# Patient Record
Sex: Female | Born: 1960 | Race: Black or African American | Hispanic: No | Marital: Single | Smoking: Never smoker
Health system: Southern US, Community
[De-identification: ages and names within clinical notes are randomized; demographics above are authoritative.]

## PROBLEM LIST (undated history)

## (undated) DIAGNOSIS — D219 Benign neoplasm of connective and other soft tissue, unspecified: Secondary | ICD-10-CM

## (undated) DIAGNOSIS — R7303 Prediabetes: Secondary | ICD-10-CM

## (undated) DIAGNOSIS — E785 Hyperlipidemia, unspecified: Secondary | ICD-10-CM

## (undated) DIAGNOSIS — D689 Coagulation defect, unspecified: Secondary | ICD-10-CM

## (undated) DIAGNOSIS — N939 Abnormal uterine and vaginal bleeding, unspecified: Secondary | ICD-10-CM

## (undated) DIAGNOSIS — I10 Essential (primary) hypertension: Secondary | ICD-10-CM

## (undated) DIAGNOSIS — Z9289 Personal history of other medical treatment: Secondary | ICD-10-CM

## (undated) DIAGNOSIS — A64 Unspecified sexually transmitted disease: Secondary | ICD-10-CM

## (undated) DIAGNOSIS — H269 Unspecified cataract: Secondary | ICD-10-CM

## (undated) DIAGNOSIS — T7840XA Allergy, unspecified, initial encounter: Secondary | ICD-10-CM

## (undated) HISTORY — DX: Allergy, unspecified, initial encounter: T78.40XA

## (undated) HISTORY — DX: Prediabetes: R73.03

## (undated) HISTORY — DX: Abnormal uterine and vaginal bleeding, unspecified: N93.9

## (undated) HISTORY — DX: Unspecified sexually transmitted disease: A64

## (undated) HISTORY — DX: Hyperlipidemia, unspecified: E78.5

## (undated) HISTORY — DX: Coagulation defect, unspecified: D68.9

## (undated) HISTORY — PX: PERIPHERAL VASCULAR THROMBECTOMY: CATH118306

## (undated) HISTORY — DX: Unspecified cataract: H26.9

## (undated) HISTORY — DX: Personal history of other medical treatment: Z92.89

## (undated) HISTORY — DX: Benign neoplasm of connective and other soft tissue, unspecified: D21.9

---

## 1998-04-11 ENCOUNTER — Emergency Department (HOSPITAL_COMMUNITY): Admission: EM | Admit: 1998-04-11 | Discharge: 1998-04-11 | Payer: Self-pay | Admitting: Emergency Medicine

## 1998-09-11 ENCOUNTER — Encounter: Payer: Self-pay | Admitting: Emergency Medicine

## 1998-09-11 ENCOUNTER — Emergency Department (HOSPITAL_COMMUNITY): Admission: EM | Admit: 1998-09-11 | Discharge: 1998-09-11 | Payer: Self-pay | Admitting: Emergency Medicine

## 1999-06-25 ENCOUNTER — Emergency Department (HOSPITAL_COMMUNITY): Admission: EM | Admit: 1999-06-25 | Discharge: 1999-06-25 | Payer: Self-pay | Admitting: Emergency Medicine

## 2000-03-26 ENCOUNTER — Emergency Department (HOSPITAL_COMMUNITY): Admission: EM | Admit: 2000-03-26 | Discharge: 2000-03-26 | Payer: Self-pay | Admitting: Emergency Medicine

## 2000-12-29 ENCOUNTER — Emergency Department (HOSPITAL_COMMUNITY): Admission: EM | Admit: 2000-12-29 | Discharge: 2000-12-29 | Payer: Self-pay | Admitting: Emergency Medicine

## 2002-03-24 ENCOUNTER — Emergency Department (HOSPITAL_COMMUNITY): Admission: EM | Admit: 2002-03-24 | Discharge: 2002-03-24 | Payer: Self-pay

## 2007-04-05 ENCOUNTER — Emergency Department (HOSPITAL_COMMUNITY): Admission: EM | Admit: 2007-04-05 | Discharge: 2007-04-05 | Payer: Self-pay | Admitting: Emergency Medicine

## 2008-03-17 ENCOUNTER — Emergency Department (HOSPITAL_COMMUNITY): Admission: EM | Admit: 2008-03-17 | Discharge: 2008-03-17 | Payer: Self-pay | Admitting: Emergency Medicine

## 2008-05-05 ENCOUNTER — Emergency Department (HOSPITAL_COMMUNITY): Admission: EM | Admit: 2008-05-05 | Discharge: 2008-05-05 | Payer: Self-pay | Admitting: Emergency Medicine

## 2008-06-05 ENCOUNTER — Emergency Department (HOSPITAL_COMMUNITY): Admission: EM | Admit: 2008-06-05 | Discharge: 2008-06-05 | Payer: Self-pay | Admitting: Emergency Medicine

## 2009-01-24 ENCOUNTER — Ambulatory Visit (HOSPITAL_COMMUNITY): Admission: RE | Admit: 2009-01-24 | Discharge: 2009-01-24 | Payer: Self-pay | Admitting: Obstetrics

## 2009-01-27 ENCOUNTER — Emergency Department (HOSPITAL_COMMUNITY): Admission: EM | Admit: 2009-01-27 | Discharge: 2009-01-27 | Payer: Self-pay | Admitting: Emergency Medicine

## 2010-09-16 HISTORY — PX: IVC FILTER INSERTION: CATH118245

## 2010-09-16 HISTORY — PX: BACK SURGERY: SHX140

## 2010-09-16 HISTORY — PX: ABDOMINAL ADHESION SURGERY: SHX90

## 2010-10-08 ENCOUNTER — Inpatient Hospital Stay (HOSPITAL_COMMUNITY)
Admission: EM | Admit: 2010-10-08 | Discharge: 2010-10-15 | Payer: Self-pay | Source: Home / Self Care | Attending: Internal Medicine | Admitting: Internal Medicine

## 2010-10-08 NOTE — H&P (Addendum)
Diana Mack, Diana Mack              ACCOUNT NO.:  [id number]  MEDICAL RECORD NO.:  [id number]          PATIENT TYPE:  EMS  LOCATION:  ED                           FACILITY:  [facility]  PHYSICIAN:  Talmage Nap, MD  DATE OF BIRTH:  Jan 31, 1961  DATE OF ADMISSION:  10/08/2010 DATE OF DISCHARGE:                             HISTORY & PHYSICAL   PRIMARY CARE PHYSICIAN:  Unassigned.  OB-GYN:  Kathreen Cosier, M.D.  CHIEF COMPLAINT:  Swelling of left leg of about a week's duration.  HISTORY OF PRESENT ILLNESS:  The patient is a 50 year old, obese, African-American female with no known medical history only recently on treatment for UTI, presenting to the emergency room with swelling of the left leg that was said to have been getting progressively worse.  The patient claimed that a week prior to presenting to the emergency room she noticed slight swelling in the left calf, and this was said to be getting progressively worse with difficulty in ambulation.  She had about one to two episodes of fall prior to presenting to the emergency room.  She denied any history of sharp chest pain.  She denied any history of shortness of breath.  She denied any history of recent travel.  She also denied any history of oral contraceptive pills.  She also she also denied any systemic symptoms, i.e. no fever, no chills, no rigor, and no trauma.  Swelling was said to be getting progressively worse.  Hence, the patient presented to the emergency room to be evaluated.  PAST MEDICAL HISTORY:  Uterine fibroid and most recently on treatment for urinary tract infection.  PAST SURGICAL HISTORY:  None.  PREADMISSION MEDICATIONS: 1. Hydrocodone/acetaminophen 5/500 on a p.r.n. basis. 2. Cephalexin 500 mg p.o. q.6. 3. Promethazine 25 mg p.o. p.r.n. 4. Ibuprofen 800 mg p.o. q.h.s.  ALLERGIES:  No known allergies.  SOCIAL HISTORY:  Negative for tobacco use.  Occasionally drinks alcohol.  FAMILY HISTORY:   Negative for any blood clot or thrombophilia.  REVIEW OF SYSTEMS:  The patient denies any history of headaches.  No blurry vision.  No nausea or vomiting.  No fever.  No chills.  No rigor. No chest pain or shortness of breath.  No cough.  No abdominal discomfort.  No diarrhea or hematochezia.  No dysuria or hematuria.  She has swelling of the left calf.  No associated shortness of breath and no associated fever, chills or rigor, and she also denied any history of known psychiatric disorder.  PHYSICAL EXAMINATION:  Very pleasant, obese lady not in any distress, well-hydrated. VITAL SIGNS: Blood pressure is blood pressure is 115/103, pulse is 93, respiratory 20, temperature is 99.3.  HEENT: Mild pallor, but pupils are reactive to light and extraocular muscles are intact. NECK:  No jugular venous distention.  No carotid bruit.  No lymphadenopathy. CHEST:  Clear to auscultation. HEART:  Sounds are 1 and 2. ABDOMEN:  Obese, nontender.  Liver and spleen not palpable.  Bowel sounds are positive. EXTREMITIES:  Tenderness in the left calf, and left calf bigger than the right calf.  [name]' sign not elicited. NEUROPSYCHIATRIC EVALUATION: Unremarkable. MUSCULOSKELETAL SYSTEM:  Unremarkable. SKIN:  Normal turgor.  LABORATORY DATA:  Initial urinalysis unremarkable. Chemistry showed a sodium of 141, potassium of 4.2, chloride of 105, BUN is  15, creatinine is 1.9, glucose is 106.  Hematological indices showed WBC of 12.0, hemoglobin 11.5, hematocrit of 30.8, MCV of 86.9, platelet count of 512, neutrophil of 80% and absolute neutrophil count of 9.6.  Coagulation profile showed a PTT 34, PT 14.8, INR 1.14.  Duplex ultrasound was positive for extensive DVT and partial thrombosis of the posterior tibial vein becoming fairly thrombosed in the proximal popliteal to profunda femoral and mid common femoral veins.  There is partial thrombosis in the mid to proximal common femoral vein and there is  also thrombosis of the superficial system of the proximal greater saphenous and the saphenofemoral junction.  There is for propagation of DVT noted.  IMPRESSION: 1. Deep venous thrombosis, left leg. 2. Obesity. 3. Incompletely treated urinary tract infection. 4. Hypertension (newly diagnosed). 5. Anemia.  PLAN:  To admit the patient to general medical floor.  The patient will be anticoagulated with Lovenox 4 mg per PEG subcutaneous q.12 hourly, and she will also have Coumadin 10 mg p.o. stat and dosing to be done by pharmacy.  Pain control will be done with Dilaudid 1 mg IV q.4 p.r.n. She will be on Protonix 40 mg p.o. daily for GI prophylaxis, Avelox 400 mg p.o. daily for the incompletely treated UTI, and Lopressor 50 mg p.o. b.i.d. for the newly diagnosed hypertension.  Further labs to be ordered on this patient will include: 1. Serum protein C and S levels. 2. Serum antithrombin III level. 3. Factor V Leiden mutation. 4. CBCD,CMP and magnesium will be repeated in a.m. 5. PT and INR will be repeated in a.m.  The patient will be followed and evaluated on a day-to-day basis.    Talmage Nap, MD    CN/MEDQ  D:  10/08/2010  T:  10/08/2010  Job:  [id number]  Electronically Signed by Talmage Nap  on 10/08/2010 07:38:19 PM

## 2010-10-09 LAB — URINALYSIS, ROUTINE W REFLEX MICROSCOPIC
Bilirubin Urine: NEGATIVE
Hgb urine dipstick: NEGATIVE
Ketones, ur: NEGATIVE mg/dL
Nitrite: NEGATIVE
Protein, ur: NEGATIVE mg/dL
Specific Gravity, Urine: 1.007 (ref 1.005–1.030)
Urine Glucose, Fasting: NEGATIVE mg/dL
Urobilinogen, UA: 0.2 mg/dL (ref 0.0–1.0)
pH: 5.5 (ref 5.0–8.0)

## 2010-10-09 LAB — POCT I-STAT, CHEM 8
BUN: 16 mg/dL (ref 6–23)
Calcium, Ion: 1.2 mmol/L (ref 1.12–1.32)
Chloride: 105 mEq/L (ref 96–112)
Creatinine, Ser: 1.9 mg/dL — ABNORMAL HIGH (ref 0.4–1.2)
Glucose, Bld: 106 mg/dL — ABNORMAL HIGH (ref 70–99)
HCT: 36 % (ref 36.0–46.0)
Hemoglobin: 12.2 g/dL (ref 12.0–15.0)
Potassium: 4.2 mEq/L (ref 3.5–5.1)
Sodium: 141 mEq/L (ref 135–145)
TCO2: 29 mmol/L (ref 0–100)

## 2010-10-09 LAB — CBC
MCH: 29.6 pg (ref 26.0–34.0)
MCHC: 34 g/dL (ref 30.0–36.0)
MCV: 86.9 fL (ref 78.0–100.0)
Platelets: 512 10*3/uL — ABNORMAL HIGH (ref 150–400)
RBC: 3.89 MIL/uL (ref 3.87–5.11)
RDW: 13.1 % (ref 11.5–15.5)

## 2010-10-09 LAB — DIFFERENTIAL
Basophils Relative: 0 % (ref 0–1)
Eosinophils Absolute: 0.2 10*3/uL (ref 0.0–0.7)
Eosinophils Relative: 1 % (ref 0–5)
Lymphs Abs: 1.6 10*3/uL (ref 0.7–4.0)
Monocytes Relative: 6 % (ref 3–12)
Neutrophils Relative %: 80 % — ABNORMAL HIGH (ref 43–77)

## 2010-10-09 LAB — PROTEIN, TOTAL: Total Protein: 8.4 g/dL — ABNORMAL HIGH (ref 6.0–8.3)

## 2010-10-09 LAB — PROTIME-INR: Prothrombin Time: 14.8 seconds (ref 11.6–15.2)

## 2010-10-10 LAB — SODIUM, URINE, RANDOM: Sodium, Ur: 43 mEq/L

## 2010-10-10 LAB — CBC
HCT: 32.3 % — ABNORMAL LOW (ref 36.0–46.0)
HCT: 29.9 % — ABNORMAL LOW (ref 36.0–46.0)
Hemoglobin: 10.9 g/dL — ABNORMAL LOW (ref 12.0–15.0)
Hemoglobin: 9.9 g/dL — ABNORMAL LOW (ref 12.0–15.0)
MCH: 29.3 pg (ref 26.0–34.0)
MCHC: 33.7 g/dL (ref 30.0–36.0)
MCV: 86.8 fL (ref 78.0–100.0)
RBC: 3.42 MIL/uL — ABNORMAL LOW (ref 3.87–5.11)
RDW: 13.2 % (ref 11.5–15.5)
RDW: 13.3 % (ref 11.5–15.5)
WBC: 10.6 10*3/uL — ABNORMAL HIGH (ref 4.0–10.5)

## 2010-10-10 LAB — COMPREHENSIVE METABOLIC PANEL
ALT: 16 U/L (ref 0–35)
Alkaline Phosphatase: 66 U/L (ref 39–117)
BUN: 15 mg/dL (ref 6–23)
CO2: 28 mEq/L (ref 19–32)
Calcium: 9.1 mg/dL (ref 8.4–10.5)
Chloride: 104 mEq/L (ref 96–112)
Creatinine, Ser: 1.53 mg/dL — ABNORMAL HIGH (ref 0.4–1.2)
GFR calc Af Amer: 44 mL/min — ABNORMAL LOW (ref >60)
GFR calc non Af Amer: 36 mL/min — ABNORMAL LOW (ref >60)
Glucose, Bld: 103 mg/dL — ABNORMAL HIGH (ref 70–99)
Glucose, Bld: 101 mg/dL — ABNORMAL HIGH (ref 70–99)
Potassium: 4.4 mEq/L (ref 3.5–5.1)
Sodium: 139 mEq/L (ref 135–145)
Total Bilirubin: 0.5 mg/dL (ref 0.3–1.2)
Total Protein: 8.2 g/dL (ref 6.0–8.3)
Total Protein: 7.7 g/dL (ref 6.0–8.3)

## 2010-10-10 LAB — PROTIME-INR
INR: 1.22 (ref 0.00–1.49)
INR: 1.46 (ref 0.00–1.49)
Prothrombin Time: 17.9 seconds — ABNORMAL HIGH (ref 11.6–15.2)

## 2010-10-10 LAB — DIFFERENTIAL
Basophils Absolute: 0 10*3/uL (ref 0.0–0.1)
Basophils Absolute: 0 10*3/uL (ref 0.0–0.1)
Eosinophils Relative: 2 % (ref 0–5)
Lymphocytes Relative: 15 % (ref 12–46)
Lymphocytes Relative: 17 % (ref 12–46)
Lymphs Abs: 1.5 10*3/uL (ref 0.7–4.0)
Monocytes Absolute: 0.8 10*3/uL (ref 0.1–1.0)
Monocytes Relative: 7 % (ref 3–12)
Neutro Abs: 8 10*3/uL — ABNORMAL HIGH (ref 1.7–7.7)
Neutro Abs: 7.8 10*3/uL — ABNORMAL HIGH (ref 1.7–7.7)

## 2010-10-10 LAB — MAGNESIUM
Magnesium: 2 mg/dL (ref 1.5–2.5)
Magnesium: 2 mg/dL (ref 1.5–2.5)

## 2010-10-10 LAB — URINE CULTURE
Colony Count: NO GROWTH
Culture  Setup Time: 201201240143

## 2010-10-11 LAB — HOMOCYSTEINE: Homocysteine: 11.9 umol/L (ref 4.0–15.4)

## 2010-10-11 LAB — CBC
HCT: 31.2 % — ABNORMAL LOW (ref 36.0–46.0)
Hemoglobin: 10.2 g/dL — ABNORMAL LOW (ref 12.0–15.0)
RBC: 3.57 MIL/uL — ABNORMAL LOW (ref 3.87–5.11)
RDW: 13.1 % (ref 11.5–15.5)
WBC: 10.8 10*3/uL — ABNORMAL HIGH (ref 4.0–10.5)

## 2010-10-11 LAB — CARDIOLIPIN ANTIBODIES, IGG, IGM, IGA
Anticardiolipin IgA: 4 APL U/mL — ABNORMAL LOW (ref <22)
Anticardiolipin IgG: 4 GPL U/mL — ABNORMAL LOW (ref <23)

## 2010-10-11 LAB — LUPUS ANTICOAGULANT PANEL
PTT Lupus Anticoagulant: 64.1 secs — ABNORMAL HIGH (ref 30.0–45.6)
PTTLA Confirmation: 14.6 secs — ABNORMAL HIGH (ref <8.0)

## 2010-10-11 LAB — BASIC METABOLIC PANEL
Chloride: 104 mEq/L (ref 96–112)
Creatinine, Ser: 1.56 mg/dL — ABNORMAL HIGH (ref 0.4–1.2)
GFR calc Af Amer: 43 mL/min — ABNORMAL LOW (ref >60)

## 2010-10-11 LAB — PROTIME-INR: INR: 1.37 (ref 0.00–1.49)

## 2010-10-11 LAB — MAGNESIUM: Magnesium: 2 mg/dL (ref 1.5–2.5)

## 2010-10-12 LAB — BASIC METABOLIC PANEL
CO2: 28 mEq/L (ref 19–32)
Calcium: 9 mg/dL (ref 8.4–10.5)
Creatinine, Ser: 1.26 mg/dL — ABNORMAL HIGH (ref 0.4–1.2)
Glucose, Bld: 100 mg/dL — ABNORMAL HIGH (ref 70–99)
Sodium: 141 mEq/L (ref 135–145)

## 2010-10-12 LAB — CBC
HCT: 31 % — ABNORMAL LOW (ref 36.0–46.0)
Hemoglobin: 10.2 g/dL — ABNORMAL LOW (ref 12.0–15.0)
MCH: 28.6 pg (ref 26.0–34.0)
MCHC: 32.9 g/dL (ref 30.0–36.0)

## 2010-10-12 LAB — DIFFERENTIAL
Lymphocytes Relative: 21 % (ref 12–46)
Lymphs Abs: 1.7 10*3/uL (ref 0.7–4.0)
Monocytes Absolute: 0.5 10*3/uL (ref 0.1–1.0)
Monocytes Relative: 6 % (ref 3–12)
Neutro Abs: 5.3 10*3/uL (ref 1.7–7.7)

## 2010-10-12 LAB — URINALYSIS, ROUTINE W REFLEX MICROSCOPIC
Nitrite: NEGATIVE
Protein, ur: NEGATIVE mg/dL
Urine Glucose, Fasting: NEGATIVE mg/dL

## 2010-10-12 LAB — PROTIME-INR: Prothrombin Time: 21.3 seconds — ABNORMAL HIGH (ref 11.6–15.2)

## 2010-10-12 LAB — URINE MICROSCOPIC-ADD ON

## 2010-10-13 LAB — BASIC METABOLIC PANEL
CO2: 27 mEq/L (ref 19–32)
Calcium: 9.2 mg/dL (ref 8.4–10.5)
Chloride: 102 mEq/L (ref 96–112)
Creatinine, Ser: 1.13 mg/dL (ref 0.4–1.2)
Glucose, Bld: 104 mg/dL — ABNORMAL HIGH (ref 70–99)

## 2010-10-13 LAB — CBC
MCH: 28.5 pg (ref 26.0–34.0)
MCHC: 32.9 g/dL (ref 30.0–36.0)
MCV: 86.7 fL (ref 78.0–100.0)
Platelets: 476 10*3/uL — ABNORMAL HIGH (ref 150–400)
RDW: 12.9 % (ref 11.5–15.5)
WBC: 9.2 10*3/uL (ref 4.0–10.5)

## 2010-10-13 LAB — DIFFERENTIAL
Eosinophils Absolute: 0.4 10*3/uL (ref 0.0–0.7)
Eosinophils Relative: 4 % (ref 0–5)
Lymphs Abs: 1.8 10*3/uL (ref 0.7–4.0)
Monocytes Absolute: 0.6 10*3/uL (ref 0.1–1.0)
Monocytes Relative: 6 % (ref 3–12)

## 2010-10-14 LAB — BASIC METABOLIC PANEL
BUN: 12 mg/dL (ref 6–23)
Creatinine, Ser: 1.14 mg/dL (ref 0.4–1.2)
GFR calc non Af Amer: 51 mL/min — ABNORMAL LOW (ref >60)

## 2010-10-14 LAB — PROTIME-INR
INR: 2.17 — ABNORMAL HIGH (ref 0.00–1.49)
Prothrombin Time: 24.3 seconds — ABNORMAL HIGH (ref 11.6–15.2)

## 2010-10-14 LAB — URINE CULTURE

## 2010-10-15 LAB — PROTIME-INR
INR: 2.09 — ABNORMAL HIGH (ref 0.00–1.49)
Prothrombin Time: 23.6 seconds — ABNORMAL HIGH (ref 11.6–15.2)

## 2010-10-15 LAB — BASIC METABOLIC PANEL
Chloride: 102 mEq/L (ref 96–112)
Creatinine, Ser: 1.09 mg/dL (ref 0.4–1.2)
GFR calc Af Amer: >60 mL/min (ref >60)
GFR calc non Af Amer: 54 mL/min — ABNORMAL LOW (ref >60)
Potassium: 3.9 mEq/L (ref 3.5–5.1)

## 2010-10-15 LAB — PHOSPHORUS: Phosphorus: 4.6 mg/dL (ref 2.3–4.6)

## 2010-10-15 LAB — MAGNESIUM: Magnesium: 2.1 mg/dL (ref 1.5–2.5)

## 2010-10-15 NOTE — Discharge Summary (Signed)
Diana Mack, Diana Mack              ACCOUNT NO.:  [id number]  MEDICAL RECORD NO.:  [id number]          PATIENT TYPE:  INP  LOCATION:  1603                         FACILITY:  [facility]  PHYSICIAN:  Rock Nephew, MD       DATE OF BIRTH:  [birth year]  DATE OF ADMISSION:  10/08/2010 DATE OF DISCHARGE:  10/15/2010                        DISCHARGE SUMMARY - REFERRING   NEW PRIMARY CARE PHYSICIAN:  [name], MD  DISCHARGE DIAGNOSES: 1. Right leg and left leg deep vein thrombosis. 2. Positive lupus anticoagulant. 3. Hypertension, possibly newly diagnosed. 4. Obesity. 5. Obstructive uropathy.  Discharged with Foley catheter. 6. Ovarian tumor. 7. Questionable urinary tract infection, culture negative.  DISCHARGE MEDICATIONS: 1. Metoprolol 50 mg p.o. twice daily. 2. Warfarin 8 mg p.o. daily. 3. Vicodin 1 capsule by mouth every 6 hours as needed.  DIET:  Heart-healthy diet with a fixed vitamin K level.  FOLLOWUP: 1. The patient should follow up with Dr. [name].  She has an     appointment on October 22, 2010 at 3:00 p.m.  The patient should     have her blood drawn for PT/INR on October 17, 2010, to be checked     by [name]. 2. The patient should also follow up with [facility] within 1     week for evaluation of the obstructive uropathy.  The patient is     not to take out her Foley catheter until she sees [facility]. 3. The patient should see Dr. [name] in 1-2 weeks for     possible ovarian tumor. 4. The patient should also see Dr. [name] or another surgical     oncologist in 1-2 weeks. 5. The patient should obtain a referral to see a hematologist in 1     month for a positive lupus anticoagulant.  The patient should also     obtain an outpatient CT scan of the abdomen and pelvis to look for     an ovarian tumor.  This could be coordinated through either Dr.     Allyne Gee or Dr. Gaynell Face or Dr. [name] or a surgical oncologist.  CONSULTATIONS:   None.  PROCEDURES PERFORMED:  The patient had a renal ultrasound which showed a left greater than right hydronephrosis.  Left hydroureter is evident. Obstructive etiology is not evident.  Consider CT scan of the abdomen and pelvis, noncontrast, if there is acute renal insufficiency. Lobulated hypoechoic mass in the deep pelvis, question fibroid uterus. The patient also had two bladder scans, one bladder scan showed greater than 999 mL of fluid and the other bladder scan showed about 600 mL of fluid.  INITIAL HISTORY AND PHYSICAL:  Chief complaint:  Swelling of the left leg for about a week's duration.  This is a 50 year old obese African American female with no known medical history, recently on treatment for UTI, presented to the emergency room with swelling of the left leg and said it has been getting progressively worse.  HOSPITAL COURSE: 1. Right and left leg deep vein thrombosis:  The patient actually had     arteriovenous Doppler findings consistent with both a right leg  DVT     and a left leg DVT.  Findings are consistent with acute deep vein     thrombosis involving the right lower extremity; findings are     consistent with acute deep vein thrombosis involving the left lower     extremity.  Findings are consistent with superficial thrombosis of     left lower extremity; findings are consistent with no evidence of     superficial thrombosis involving the right lower extremity. 2. Positive lupus anticoagulant:  The patient had hypercoagulable     panel testing.  The hypercoagulable panel showed a positive lupus     anticoagulant.  The patient had an elevated PTT.  Elevated PTT also     on the mixing study.  The patient's anti-cardiolipin IgG was a     level 4, IgM was 4 and anticardiolipin IgA was 4 also. 3. Hypertension:  This was apparently a new diagnosis.  The patient's     blood pressure was elevated.  As a result, the patient was placed     on metoprolol 50 mg p.o.  b.i.d. 4. Obstructive uropathy:  The patient was discovered to have     obstructive uropathy by a bladder scan on October 11, 2010.  The     patient had a Foley catheter placed.  The patient reported she was     not happy with having a Foley catheter and the patient demanded on     October 14, 2010 to take the Foley catheter out.  I explained to     the patient in detail why the Foley catheter could not be taken out     because of obstructive uropathy.  The patient reported that she had     obstructive uropathy from a urinary tract infection.  The patient's     urine culture from the beginning of the admission on October 08, 2010 as well as October 12, 2010 were negative.  However, even     after detailed explanation the patient still wanted the Foley     catheter out.  The Foley catheter had to be removed on October 14, 2010.  The next morning we repeated a bladder scan; the patient had     600 mL of fluid in the bladder.  As a result, a Foley was placed     and the patient agreed to have the Foley in place.  The patient is     urged to follow up with [facility] within 1 week to take the     Foley catheter out. 5. Possible ovarian tumor:  The patient received a call from [facility]  The patient had a questionable fibroid tumor, and that the     patient needed to see Dr. [name].  The patient was referred to Dr.     [name] who is a Surgical Oncologist in [place].  I urged the     patient to follow up with [name] or Dr. [name] in about 1-     2 weeks.  The patient reported to me that she did not want to go to     [facility].  I urged the importance of following up with a     surgical gynecologist as well as a gynecologist.  The patient also     should obtain a CT scan of the abdomen and pelvis as an outpatient     for  further evaluation of obstructive uropathy. 6. Questionable UTI:  The patient received 3 days of Rocephin in the     hospital.  She  reported some burning on urination, however, the     patient's urine culture was negative. 7. DVT prophylaxis:  The patient is on warfarin. Also of question this patient seems to have limited intelligence and everything has to be explained in great detail to the patient and all instructions should be written down for this patient.  She asked the same questions again and again and has difficulty understanding things.     Rock Nephew, MD     NH/MEDQ  D:  10/15/2010  T:  10/15/2010  Job:  [id number]  cc:   Hazle Coca Fax: [phone]  [facility] [address] [place], [address] [facility] [place], [place] [address]  Candyce Churn. Allyne Gee, M.D. Fax: [phone]  Kathreen Cosier, M.D. Fax: [phone]  Electronically Signed by Rock Nephew MD on 10/15/2010 05:02:09 PM

## 2010-10-16 LAB — FACTOR 5 LEIDEN

## 2010-10-26 ENCOUNTER — Other Ambulatory Visit: Payer: Self-pay | Admitting: Internal Medicine

## 2010-10-26 DIAGNOSIS — D219 Benign neoplasm of connective and other soft tissue, unspecified: Secondary | ICD-10-CM

## 2010-10-30 ENCOUNTER — Ambulatory Visit
Admission: RE | Admit: 2010-10-30 | Discharge: 2010-10-30 | Disposition: A | Discharge status: Home / Self Care | Payer: PRIVATE HEALTH INSURANCE | Source: Ambulatory Visit | Attending: Internal Medicine | Admitting: Internal Medicine

## 2010-10-30 DIAGNOSIS — D219 Benign neoplasm of connective and other soft tissue, unspecified: Secondary | ICD-10-CM

## 2010-10-30 MED ORDER — IOHEXOL 300 MG/ML  SOLN
100.0000 mL | Freq: Once | INTRAMUSCULAR | Status: AC | PRN
  Administered 2010-10-30: 100 mL via INTRAVENOUS

## 2010-11-16 ENCOUNTER — Encounter: Payer: PRIVATE HEALTH INSURANCE | Admitting: Hematology and Oncology

## 2010-12-07 ENCOUNTER — Other Ambulatory Visit: Payer: Self-pay | Admitting: Obstetrics and Gynecology

## 2010-12-19 ENCOUNTER — Other Ambulatory Visit: Payer: Self-pay | Admitting: Hematology and Oncology

## 2010-12-19 ENCOUNTER — Encounter (HOSPITAL_BASED_OUTPATIENT_CLINIC_OR_DEPARTMENT_OTHER): Payer: PRIVATE HEALTH INSURANCE | Admitting: Hematology and Oncology

## 2010-12-19 DIAGNOSIS — I749 Embolism and thrombosis of unspecified artery: Secondary | ICD-10-CM

## 2010-12-19 DIAGNOSIS — R894 Abnormal immunological findings in specimens from other organs, systems and tissues: Secondary | ICD-10-CM

## 2010-12-19 LAB — CBC WITH DIFFERENTIAL/PLATELET
Basophils Absolute: 0 10*3/uL (ref 0.0–0.1)
Eosinophils Absolute: 0.2 10*3/uL (ref 0.0–0.5)
HCT: 37.5 % (ref 34.8–46.6)
LYMPH%: 35.8 % (ref 14.0–49.7)
MCV: 85.7 fL (ref 79.5–101.0)
MONO#: 0.3 10*3/uL (ref 0.1–0.9)
MONO%: 4.6 % (ref 0.0–14.0)
NEUT#: 3.3 10*3/uL (ref 1.5–6.5)
NEUT%: 56 % (ref 38.4–76.8)
Platelets: 294 10*3/uL (ref 145–400)
RBC: 4.38 10*6/uL (ref 3.70–5.45)

## 2010-12-19 LAB — LACTATE DEHYDROGENASE: LDH: 180 U/L (ref 94–250)

## 2010-12-19 LAB — COMPREHENSIVE METABOLIC PANEL
Alkaline Phosphatase: 71 U/L (ref 39–117)
BUN: 15 mg/dL (ref 6–23)
CO2: 26 mEq/L (ref 19–32)
Creatinine, Ser: 1.07 mg/dL (ref 0.40–1.20)
Glucose, Bld: 104 mg/dL — ABNORMAL HIGH (ref 70–99)
Sodium: 140 mEq/L (ref 135–145)
Total Bilirubin: 0.3 mg/dL (ref 0.3–1.2)

## 2010-12-19 LAB — PROTIME-INR: Protime: 30 Seconds — ABNORMAL HIGH (ref 10.6–13.4)

## 2010-12-20 ENCOUNTER — Ambulatory Visit: Payer: PRIVATE HEALTH INSURANCE | Attending: Gynecologic Oncology | Admitting: Gynecologic Oncology

## 2010-12-20 DIAGNOSIS — D259 Leiomyoma of uterus, unspecified: Secondary | ICD-10-CM | POA: Insufficient documentation

## 2010-12-20 DIAGNOSIS — Z86718 Personal history of other venous thrombosis and embolism: Secondary | ICD-10-CM | POA: Insufficient documentation

## 2010-12-20 DIAGNOSIS — Z7901 Long term (current) use of anticoagulants: Secondary | ICD-10-CM | POA: Insufficient documentation

## 2010-12-20 DIAGNOSIS — N979 Female infertility, unspecified: Secondary | ICD-10-CM | POA: Insufficient documentation

## 2010-12-20 DIAGNOSIS — R894 Abnormal immunological findings in specimens from other organs, systems and tissues: Secondary | ICD-10-CM | POA: Insufficient documentation

## 2010-12-20 DIAGNOSIS — N9489 Other specified conditions associated with female genital organs and menstrual cycle: Secondary | ICD-10-CM | POA: Insufficient documentation

## 2010-12-20 DIAGNOSIS — I10 Essential (primary) hypertension: Secondary | ICD-10-CM | POA: Insufficient documentation

## 2010-12-24 NOTE — Consult Note (Addendum)
Diana Mack, Diana Mack              ACCOUNT NO.:  [id number]  MEDICAL RECORD NO.:  [id number]           PATIENT TYPE:  LOCATION:                               FACILITY:  [facility]  PHYSICIAN:  Laurette Schimke, MD     DATE OF BIRTH:  Oct 16, 1960  DATE OF CONSULTATION:  12/20/2010 DATE OF DISCHARGE:                                CONSULTATION   REASON FOR VISIT:  Consult was requested by Dr. Su Hilt for management of bilateral adnexal masses.  HISTORY OF PRESENT ILLNESS:  This is a 50 year old nulliparous female with longstanding primary infertility.  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  Her history is notable for the fact that on Jan 27, 2009, she was noted to have 2 homogeneous masses that were posterior to the uterus possibly representing endometriomas with low-level internal echoes measuring 5.6 x 3.5 and 5.2 x 4.7.  A CT scan obtained on October 30, 2009, which was done after adiagnosis of bilateral lower extremity DVTs was notable for the presence of oval low-attenuation structures within both adnexa which appeared to abut each other and connect across the midline.  The structures were thick walled and most likely to contain fluid.  Multiple uterine fibroids were identified.  There was no evidence of hydronephrosis.  A CA-125 was obtained and returned with a value of 62.7.  A pelvic ultrasound was performed on December 04, 2010 which showed a right ovarian complex mass measuring 3.45 rate 3.73 x 3.34 with a solid component measuring 2.74 x 3.07 x 3.59.  Left ovarian complex mass was noted measuring 3.41 x 2.5 9 x 3.24, again with a solid component.  The solid component was 1.33 x 2.75 x 3.25.  Ms. Brand states that she does have some premenstrual pain, uterine cramping but does not require medication.  She denies dyspareunia.  She denies menorrhagia.  Reports weight gain and no  changes in her bowel habits.  She denies abdominal bloating, early satiety or lower abdominal pain.  She was evaluated by Dr. Vicente Serene I. Odogwu for the lower extremity deep venous thrombosis and informs me that she was told that she had lupus anticoagulant.  I am not aware of further details or etiology of the bilateral deep venous thrombosis and we will seek out Dr. Vicente Serene I. Odogwu's evaluation.  PAST MEDICAL HISTORY:  Bilateral lower extremity deep venous thrombus in January of 2012, lupus anticoagulant per the patient's report, hypertension diagnosed in January of 2012.  PAST SURGICAL HISTORY:  None.  FAMILY HISTORY:  Notable for maternal aunt with lower extremity deep venous thrombosis.  PAST GYN HISTORY:  Menarche occurred at age of 33 with regular menses. She has never used birth control, is sexually active and is very strongly desirous of maintaining any possible option for natural fertility.  SOCIAL HISTORY:  Works as a Engineer, site.  Denies tobacco or alcohol use.  MEDICATIONS:  Metoprolol 50 mg twice daily, warfarin dose unspecified  ALLERGIES:  KEFLEX that causes significant GI upset.  REVIEW OF SYSTEMS:  Ten-point review of systems, negative and positive as noted above.  PHYSICAL EXAMINATION:  VITAL SIGNS:  Weight 197 pounds, height 5 feet 2 inches, BMI 36, blood pressure 128/70, pulse of 68. GENERAL:  Well-developed, very pleasant female, in no acute distress. CHEST:  Clear to auscultation. HEART:  Regular rate and rhythm. LYMPH NODE SURVEY:  No cervical, supraclavicular or inguinal adenopathy. ABDOMEN:  Soft, obese.  No palpable masses.  No palpable omental cake. PELVIC EXAMINATION:  Normal external genitalia, Bartholin's, urethral and Skene's.  Uterus is well supported.  Leiomyoma are palpable.  No nodularities noted within the cul-de-sac.  Adnexal masses are not appreciated. RECTAL EXAMINATION:  Good anal sphincter tone without any  masses.  IMPRESSION:  Ms. Darrow is a 50 year old with a recent diagnosis of bilateral lower extremity deep venous thrombosis, currently on Coumadin, was managed by Murtis Sink, on CT scan notes the presence of adnexal masses.  Adnexal masses are confirmed on ultrasound to have both solid components.  Differential diagnosis is inclusive of stromal tumor versus endometrioma versus malignant ovarian neoplasm.  Ms. Flythe is very insistent about her preference for removal of the masses with clear preference to preserving her ovaries.  She is aware that in the event that malignancy is identified, appropriate course will be hysterectomy, bilateral salpingo-oophorectomy, debulking and staging as indicated. Otherwise the plan is to perform bilateral ovarian cystectomies and await the pathologic evaluation.  Given the patient's preference and fact that these are solid masses, the procedure will be done open.  The proposed date of surgery is that of Feb 05, 2011.  We welcome Dr. Lelon Perla and Dr. Lonell Face recommendations regarding management of her Coumadin prior to surgery.     Laurette Schimke, MD     WB/MEDQ  D:  12/20/2010  T:  12/20/2010  Job:  [id number]  cc:   Dr. Nils Pyle, R.N. [address] [place], [place] [address]  Vicente Serene I. Odogwu, M.D. Fax: [phone]  Dr. Murtis Sink  Electronically Signed by Laurette Schimke MD on 12/25/2010 07:05:25 AM

## 2010-12-25 LAB — URINALYSIS, ROUTINE W REFLEX MICROSCOPIC
Glucose, UA: NEGATIVE mg/dL
Ketones, ur: NEGATIVE mg/dL
Nitrite: NEGATIVE
Specific Gravity, Urine: 1.024 (ref 1.005–1.030)
pH: 6 (ref 5.0–8.0)

## 2010-12-25 LAB — POCT PREGNANCY, URINE: Preg Test, Ur: NEGATIVE

## 2010-12-27 ENCOUNTER — Ambulatory Visit: Payer: PRIVATE HEALTH INSURANCE | Admitting: Gynecologic Oncology

## 2011-02-01 ENCOUNTER — Other Ambulatory Visit: Payer: Self-pay | Admitting: Gynecologic Oncology

## 2011-02-01 ENCOUNTER — Ambulatory Visit (HOSPITAL_COMMUNITY)
Admission: RE | Admit: 2011-02-01 | Discharge: 2011-02-01 | Disposition: A | Discharge status: Home / Self Care | Payer: Medicaid Other | Source: Ambulatory Visit | Attending: Obstetrics & Gynecology | Admitting: Obstetrics & Gynecology

## 2011-02-01 ENCOUNTER — Other Ambulatory Visit: Payer: Self-pay | Admitting: Obstetrics & Gynecology

## 2011-02-01 ENCOUNTER — Encounter (HOSPITAL_COMMUNITY)
Admission: RE | Admit: 2011-02-01 | Discharge: 2011-02-01 | Disposition: A | Discharge status: Home / Self Care | Payer: Medicaid Other | Source: Ambulatory Visit | Attending: Obstetrics & Gynecology | Admitting: Obstetrics & Gynecology

## 2011-02-01 DIAGNOSIS — N83209 Unspecified ovarian cyst, unspecified side: Secondary | ICD-10-CM | POA: Insufficient documentation

## 2011-02-01 DIAGNOSIS — Z01810 Encounter for preprocedural cardiovascular examination: Secondary | ICD-10-CM | POA: Insufficient documentation

## 2011-02-01 DIAGNOSIS — Z01811 Encounter for preprocedural respiratory examination: Secondary | ICD-10-CM

## 2011-02-01 DIAGNOSIS — Z01812 Encounter for preprocedural laboratory examination: Secondary | ICD-10-CM | POA: Insufficient documentation

## 2011-02-01 DIAGNOSIS — R9431 Abnormal electrocardiogram [ECG] [EKG]: Secondary | ICD-10-CM | POA: Insufficient documentation

## 2011-02-01 DIAGNOSIS — Z01818 Encounter for other preprocedural examination: Secondary | ICD-10-CM | POA: Insufficient documentation

## 2011-02-01 LAB — COMPREHENSIVE METABOLIC PANEL
ALT: 18 U/L (ref 0–35)
AST: 19 U/L (ref 0–37)
Albumin: 3.7 g/dL (ref 3.5–5.2)
Alkaline Phosphatase: 78 U/L (ref 39–117)
BUN: 17 mg/dL (ref 6–23)
Chloride: 103 mEq/L (ref 96–112)
Potassium: 4.9 mEq/L (ref 3.5–5.1)
Sodium: 140 mEq/L (ref 135–145)
Total Bilirubin: 0.2 mg/dL — ABNORMAL LOW (ref 0.3–1.2)

## 2011-02-01 LAB — APTT: aPTT: 37 seconds (ref 24–37)

## 2011-02-01 LAB — CBC
RBC: 4.93 MIL/uL (ref 3.87–5.11)
RDW: 13.7 % (ref 11.5–15.5)

## 2011-02-01 LAB — DIFFERENTIAL
Basophils Absolute: 0 10*3/uL (ref 0.0–0.1)
Lymphocytes Relative: 38 % (ref 12–46)
Monocytes Absolute: 0.3 10*3/uL (ref 0.1–1.0)
Neutro Abs: 3 10*3/uL (ref 1.7–7.7)

## 2011-02-01 LAB — PROTIME-INR: INR: 1.48 (ref 0.00–1.49)

## 2011-02-01 LAB — TYPE AND SCREEN: ABO/RH(D): B POS

## 2011-02-01 LAB — SURGICAL PCR SCREEN: MRSA, PCR: NEGATIVE

## 2011-02-05 ENCOUNTER — Inpatient Hospital Stay (HOSPITAL_COMMUNITY)
Admission: RE | Admit: 2011-02-05 | Discharge: 2011-02-09 | Disposition: A | Discharge status: Rehabilitation Facility | Payer: Medicaid Other | Source: Ambulatory Visit | Attending: Obstetrics & Gynecology | Admitting: Obstetrics & Gynecology

## 2011-02-05 ENCOUNTER — Other Ambulatory Visit: Payer: Self-pay | Admitting: Gynecologic Oncology

## 2011-02-05 DIAGNOSIS — Z01810 Encounter for preprocedural cardiovascular examination: Secondary | ICD-10-CM

## 2011-02-05 DIAGNOSIS — Z79899 Other long term (current) drug therapy: Secondary | ICD-10-CM

## 2011-02-05 DIAGNOSIS — E872 Acidosis, unspecified: Secondary | ICD-10-CM | POA: Diagnosis not present

## 2011-02-05 DIAGNOSIS — N803 Endometriosis of pelvic peritoneum, unspecified: Secondary | ICD-10-CM | POA: Diagnosis present

## 2011-02-05 DIAGNOSIS — R112 Nausea with vomiting, unspecified: Secondary | ICD-10-CM | POA: Diagnosis not present

## 2011-02-05 DIAGNOSIS — Y838 Other surgical procedures as the cause of abnormal reaction of the patient, or of later complication, without mention of misadventure at the time of the procedure: Secondary | ICD-10-CM | POA: Diagnosis present

## 2011-02-05 DIAGNOSIS — E8779 Other fluid overload: Secondary | ICD-10-CM | POA: Diagnosis not present

## 2011-02-05 DIAGNOSIS — Z01818 Encounter for other preprocedural examination: Secondary | ICD-10-CM

## 2011-02-05 DIAGNOSIS — N80109 Endometriosis of ovary, unspecified side, unspecified depth: Secondary | ICD-10-CM | POA: Diagnosis present

## 2011-02-05 DIAGNOSIS — J9589 Other postprocedural complications and disorders of respiratory system, not elsewhere classified: Secondary | ICD-10-CM | POA: Diagnosis not present

## 2011-02-05 DIAGNOSIS — E669 Obesity, unspecified: Secondary | ICD-10-CM | POA: Diagnosis present

## 2011-02-05 DIAGNOSIS — D72829 Elevated white blood cell count, unspecified: Secondary | ICD-10-CM | POA: Diagnosis not present

## 2011-02-05 DIAGNOSIS — Z6841 Body Mass Index (BMI) 40.0 and over, adult: Secondary | ICD-10-CM

## 2011-02-05 DIAGNOSIS — Z23 Encounter for immunization: Secondary | ICD-10-CM

## 2011-02-05 DIAGNOSIS — I82509 Chronic embolism and thrombosis of unspecified deep veins of unspecified lower extremity: Secondary | ICD-10-CM | POA: Diagnosis present

## 2011-02-05 DIAGNOSIS — Z7901 Long term (current) use of anticoagulants: Secondary | ICD-10-CM

## 2011-02-05 DIAGNOSIS — R1310 Dysphagia, unspecified: Secondary | ICD-10-CM | POA: Diagnosis not present

## 2011-02-05 DIAGNOSIS — K560 Paralytic ileus: Secondary | ICD-10-CM | POA: Diagnosis not present

## 2011-02-05 DIAGNOSIS — E119 Type 2 diabetes mellitus without complications: Secondary | ICD-10-CM | POA: Diagnosis not present

## 2011-02-05 DIAGNOSIS — Y9210 Unspecified residential institution as the place of occurrence of the external cause: Secondary | ICD-10-CM | POA: Diagnosis present

## 2011-02-05 DIAGNOSIS — IMO0002 Reserved for concepts with insufficient information to code with codable children: Secondary | ICD-10-CM | POA: Diagnosis not present

## 2011-02-05 DIAGNOSIS — D62 Acute posthemorrhagic anemia: Secondary | ICD-10-CM | POA: Diagnosis not present

## 2011-02-05 DIAGNOSIS — G9349 Other encephalopathy: Secondary | ICD-10-CM | POA: Diagnosis not present

## 2011-02-05 DIAGNOSIS — Z01812 Encounter for preprocedural laboratory examination: Secondary | ICD-10-CM

## 2011-02-05 DIAGNOSIS — E876 Hypokalemia: Secondary | ICD-10-CM | POA: Diagnosis not present

## 2011-02-05 DIAGNOSIS — N9989 Other postprocedural complications and disorders of genitourinary system: Secondary | ICD-10-CM | POA: Diagnosis not present

## 2011-02-05 DIAGNOSIS — I469 Cardiac arrest, cause unspecified: Secondary | ICD-10-CM | POA: Diagnosis not present

## 2011-02-05 DIAGNOSIS — D6859 Other primary thrombophilia: Secondary | ICD-10-CM | POA: Diagnosis present

## 2011-02-05 DIAGNOSIS — R5381 Other malaise: Secondary | ICD-10-CM | POA: Diagnosis not present

## 2011-02-05 DIAGNOSIS — T8119XA Other postprocedural shock, initial encounter: Secondary | ICD-10-CM | POA: Diagnosis not present

## 2011-02-05 DIAGNOSIS — N170 Acute kidney failure with tubular necrosis: Secondary | ICD-10-CM | POA: Diagnosis not present

## 2011-02-05 DIAGNOSIS — N736 Female pelvic peritoneal adhesions (postinfective): Secondary | ICD-10-CM | POA: Diagnosis present

## 2011-02-05 DIAGNOSIS — N801 Endometriosis of ovary: Secondary | ICD-10-CM | POA: Diagnosis present

## 2011-02-05 DIAGNOSIS — N838 Other noninflammatory disorders of ovary, fallopian tube and broad ligament: Secondary | ICD-10-CM | POA: Diagnosis present

## 2011-02-05 DIAGNOSIS — I10 Essential (primary) hypertension: Secondary | ICD-10-CM | POA: Diagnosis present

## 2011-02-05 LAB — APTT: aPTT: 32 seconds (ref 24–37)

## 2011-02-05 LAB — PROTIME-INR
INR: 0.97 (ref 0.00–1.49)
Prothrombin Time: 13.1 seconds (ref 11.6–15.2)

## 2011-02-06 LAB — BASIC METABOLIC PANEL
BUN: 11 mg/dL (ref 6–23)
CO2: 24 mEq/L (ref 19–32)
Calcium: 8.4 mg/dL (ref 8.4–10.5)
Creatinine, Ser: 1.02 mg/dL (ref 0.4–1.2)
GFR calc Af Amer: >60 mL/min (ref >60)

## 2011-02-06 LAB — CBC
MCH: 28.7 pg (ref 26.0–34.0)
MCHC: 33 g/dL (ref 30.0–36.0)
MCV: 86.9 fL (ref 78.0–100.0)
Platelets: 325 10*3/uL (ref 150–400)

## 2011-02-06 NOTE — Op Note (Addendum)
NAMENATALINA, WIETING              ACCOUNT NO.:  [id number]  MEDICAL RECORD NO.:  [id number]           PATIENT TYPE:  I  LOCATION:  1531                         FACILITY:  [facility]  PHYSICIAN:  Laurette Schimke, MD     DATE OF BIRTH:  16-May-1961  DATE OF PROCEDURE:  02/05/2011 DATE OF DISCHARGE:                              OPERATIVE REPORT   PREOPERATIVE DIAGNOSIS:  Pelvic mass.  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  PROCEDURE:  Exploratory laparotomy, biopsies, omental biopsy.  SURGEON:  Laurette Schimke, MD  ASSISTANT:  Roseanna Rainbow, MD, and Telford Nab, RN  ANESTHESIA:  General endotracheal.  FINDINGS:  Extensive pelvic fibrosis.  The rectosigmoid adherent to the posterior uterus and pelvic sidewalls.  The bladder was adherent and fixed overlapping the fundus of the uterus.  The appendix was encased within the fibrous adhesions and was not visualized.  The cecum was partially involved with the dense pelvic adhesions.  DESCRIPTION OF PROCEDURE:  The patient was taken to the operating room, placed under general endotracheal anesthesia without any difficulty.  A Pfannenstiel incision was made and the pelvis entered.  Washings were obtained.  On initial evaluation, the anatomy was very difficult to understand.  After better comprehension, the upper abdomen was explored and no abnormalities appreciated.  No evidence of endometriosis in the upper abdomen.  There were some adhesions anteriorly.  Omentum was very unimpressive.  The rectum was dissected off its attachments to the bladder very carefully for approximately half an hour.  This did not result in entering any spaces. The right round ligament was identified and entered with retroperitoneal entry, thick smooth nodularity was appreciated;  however, the broad ligament could not be identified. Further dissection of the bladder was continued and the uterine fundus was  visualized after approximately half an hour, approximately at 2 cm area the uterine fundus was identified.  In order to remove the uterus, this would have required a rectosigmoid resection with partial bladder resection, hysterectomy, bilateral salpingo-oophorectomy.  Since the patient was asymptomatic and there was no evidence of intraperitoneal disease, decision was made to perform random biopsies, perform omental biopsy, and washings.  This patient has a frozen pelvis and hysterectomy and bilateral salpingo- oophorectomy is not possible.  Pelvis was copiously irrigated and drained.  A catheter was placed into the rectum and the rectum insufflated to ensure there is no enterotomy of the rectosigmoid during dissection.  There was no air noted within the pelvis.  The fascia was closed with 0 PDS sutures.  Subcutaneous tissues were copiously irrigated and drained and the subcutaneous tissues approximated with 2-0 Vicryl.  The skin was closed with 4-0 subcuticular Vicryl sutures.  Sponge, instrument, and needle count correct x3.  DISPOSITION:  The patient was extubated and taken to recovery room in stable condition.     Laurette Schimke, MD     WB/MEDQ  D:  02/05/2011  T:  02/06/2011  Job:  [id number]  cc:   Roseanna Rainbow, M.D. Fax: [phone]  Telford Nab, R.N. [address] [place], [place] [address]  Osborn Coho, MD  Melina Schools  Kennon Portela, M.D. Fax: [phone]  Electronically Signed by Laurette Schimke MD on 02/06/2011 01:09:57 PM

## 2011-02-07 LAB — CBC
MCH: 28.8 pg (ref 26.0–34.0)
Platelets: 274 10*3/uL (ref 150–400)
RBC: 3.71 MIL/uL — ABNORMAL LOW (ref 3.87–5.11)
RDW: 13.7 % (ref 11.5–15.5)
WBC: 12.4 10*3/uL — ABNORMAL HIGH (ref 4.0–10.5)

## 2011-02-08 ENCOUNTER — Inpatient Hospital Stay (HOSPITAL_COMMUNITY): Payer: Medicaid Other

## 2011-02-08 DIAGNOSIS — N179 Acute kidney failure, unspecified: Secondary | ICD-10-CM

## 2011-02-08 DIAGNOSIS — I469 Cardiac arrest, cause unspecified: Secondary | ICD-10-CM

## 2011-02-08 DIAGNOSIS — R578 Other shock: Secondary | ICD-10-CM

## 2011-02-08 DIAGNOSIS — D6489 Other specified anemias: Secondary | ICD-10-CM

## 2011-02-08 LAB — DIFFERENTIAL
Basophils Absolute: 0 10*3/uL (ref 0.0–0.1)
Eosinophils Absolute: 0 10*3/uL (ref 0.0–0.7)
Eosinophils Relative: 0 % (ref 0–5)
Eosinophils Relative: 0 % (ref 0–5)
Lymphs Abs: 2.4 10*3/uL (ref 0.7–4.0)
Lymphs Abs: 2.3 10*3/uL (ref 0.7–4.0)
Monocytes Absolute: 1 10*3/uL (ref 0.1–1.0)
Monocytes Absolute: 0.8 10*3/uL (ref 0.1–1.0)
Neutro Abs: 10.4 10*3/uL — ABNORMAL HIGH (ref 1.7–7.7)
Neutrophils Relative %: 83 % — ABNORMAL HIGH (ref 43–77)

## 2011-02-08 LAB — COMPREHENSIVE METABOLIC PANEL
ALT: 11 U/L (ref 0–35)
Albumin: 2.8 g/dL — ABNORMAL LOW (ref 3.5–5.2)
Albumin: 1.9 g/dL — ABNORMAL LOW (ref 3.5–5.2)
Alkaline Phosphatase: 37 U/L — ABNORMAL LOW (ref 39–117)
CO2: 18 mEq/L — ABNORMAL LOW (ref 19–32)
Calcium: 6.5 mg/dL — ABNORMAL LOW (ref 8.4–10.5)
GFR calc Af Amer: 28 mL/min — ABNORMAL LOW (ref >60)
GFR calc Af Amer: 28 mL/min — ABNORMAL LOW (ref >60)
Glucose, Bld: 195 mg/dL — ABNORMAL HIGH (ref 70–99)
Potassium: 4.6 mEq/L (ref 3.5–5.1)
Potassium: 4.2 mEq/L (ref 3.5–5.1)
Sodium: 129 mEq/L — ABNORMAL LOW (ref 135–145)
Sodium: 140 mEq/L (ref 135–145)
Total Bilirubin: 0.2 mg/dL — ABNORMAL LOW (ref 0.3–1.2)
Total Protein: 4.1 g/dL — ABNORMAL LOW (ref 6.0–8.3)

## 2011-02-08 LAB — CBC
HCT: 24.8 % — ABNORMAL LOW (ref 36.0–46.0)
HCT: 10 % — ABNORMAL LOW (ref 36.0–46.0)
HCT: 29.5 % — ABNORMAL LOW (ref 36.0–46.0)
Hemoglobin: 3.5 g/dL — CL (ref 12.0–15.0)
MCH: 30.1 pg (ref 26.0–34.0)
MCHC: 33.5 g/dL (ref 30.0–36.0)
MCHC: 35.6 g/dL (ref 30.0–36.0)
MCV: 84 fL (ref 78.0–100.0)
RBC: 1.19 MIL/uL — ABNORMAL LOW (ref 3.87–5.11)
RDW: 13.8 % (ref 11.5–15.5)
RDW: 13.4 % (ref 11.5–15.5)
WBC: 20 10*3/uL — ABNORMAL HIGH (ref 4.0–10.5)
WBC: 19.2 10*3/uL — ABNORMAL HIGH (ref 4.0–10.5)

## 2011-02-08 LAB — PROTIME-INR
INR: 1.14 (ref 0.00–1.49)
Prothrombin Time: 14.8 seconds (ref 11.6–15.2)
Prothrombin Time: 18.5 seconds — ABNORMAL HIGH (ref 11.6–15.2)
Prothrombin Time: 15.3 seconds — ABNORMAL HIGH (ref 11.6–15.2)

## 2011-02-08 LAB — HEPATIC FUNCTION PANEL
Albumin: 3.3 g/dL — ABNORMAL LOW (ref 3.5–5.2)
Indirect Bilirubin: 0.3 mg/dL (ref 0.3–0.9)
Total Protein: 6.5 g/dL (ref 6.0–8.3)

## 2011-02-08 LAB — BASIC METABOLIC PANEL
BUN: 22 mg/dL (ref 6–23)
Calcium: 8.8 mg/dL (ref 8.4–10.5)
Creatinine, Ser: 1.33 mg/dL — ABNORMAL HIGH (ref 0.4–1.2)
GFR calc non Af Amer: 43 mL/min — ABNORMAL LOW (ref >60)
Glucose, Bld: 203 mg/dL — ABNORMAL HIGH (ref 70–99)
Potassium: 4.5 mEq/L (ref 3.5–5.1)

## 2011-02-08 LAB — URINALYSIS, ROUTINE W REFLEX MICROSCOPIC
Bilirubin Urine: NEGATIVE
Ketones, ur: NEGATIVE mg/dL
Leukocytes, UA: NEGATIVE
Nitrite: NEGATIVE
Protein, ur: 100 mg/dL — AB
pH: 5 (ref 5.0–8.0)

## 2011-02-08 LAB — CK TOTAL AND CKMB (NOT AT ARMC)
CK, MB: 2 ng/mL (ref 0.3–4.0)
Relative Index: 1.9 (ref 0.0–2.5)
Total CK: 106 U/L (ref 7–177)

## 2011-02-08 LAB — LACTATE DEHYDROGENASE: LDH: 378 U/L — ABNORMAL HIGH (ref 94–250)

## 2011-02-08 LAB — BLOOD GAS, VENOUS
O2 Saturation: 26.9 %
PEEP: 0 cmH2O
RATE: 20 resp/min
pO2, Ven: 31.4 mmHg (ref 30.0–45.0)

## 2011-02-08 LAB — BLOOD GAS, ARTERIAL
Acid-base deficit: 8.9 mmol/L — ABNORMAL HIGH (ref 0.0–2.0)
Bicarbonate: 18.2 mEq/L — ABNORMAL LOW (ref 20.0–24.0)
Drawn by: 235321
FIO2: 100 %
MECHVT: 420 mL
PEEP: 5 cmH2O
RATE: 35 resp/min
TCO2: 19 mmol/L (ref 0–100)
pCO2 arterial: 53.2 mmHg — ABNORMAL HIGH (ref 35.0–45.0)
pCO2 arterial: 28.8 mmHg — ABNORMAL LOW (ref 35.0–45.0)
pH, Arterial: 7.452 — ABNORMAL HIGH (ref 7.350–7.400)
pO2, Arterial: 439 mmHg — ABNORMAL HIGH (ref 80.0–100.0)
pO2, Arterial: 91.8 mmHg (ref 80.0–100.0)

## 2011-02-08 LAB — MAGNESIUM: Magnesium: 2.2 mg/dL (ref 1.5–2.5)

## 2011-02-08 LAB — HEPARIN LEVEL (UNFRACTIONATED)
Heparin Unfractionated: 0.79 IU/mL — ABNORMAL HIGH (ref 0.30–0.70)
Heparin Unfractionated: 0.89 IU/mL — ABNORMAL HIGH (ref 0.30–0.70)

## 2011-02-08 LAB — AMYLASE: Amylase: 156 U/L — ABNORMAL HIGH (ref 0–105)

## 2011-02-08 LAB — PHOSPHORUS: Phosphorus: 5 mg/dL — ABNORMAL HIGH (ref 2.3–4.6)

## 2011-02-08 LAB — URINE MICROSCOPIC-ADD ON

## 2011-02-09 ENCOUNTER — Inpatient Hospital Stay (HOSPITAL_COMMUNITY): Payer: Medicaid Other

## 2011-02-09 ENCOUNTER — Inpatient Hospital Stay (HOSPITAL_COMMUNITY)
Admission: AD | Admit: 2011-02-09 | Discharge: 2011-02-22 | DRG: 167 | Disposition: A | Discharge status: Rehabilitation Facility | Payer: Medicaid Other | Source: Other Acute Inpatient Hospital | Attending: Internal Medicine | Admitting: Internal Medicine

## 2011-02-09 DIAGNOSIS — D72829 Elevated white blood cell count, unspecified: Secondary | ICD-10-CM | POA: Diagnosis present

## 2011-02-09 DIAGNOSIS — D689 Coagulation defect, unspecified: Secondary | ICD-10-CM

## 2011-02-09 DIAGNOSIS — D684 Acquired coagulation factor deficiency: Secondary | ICD-10-CM

## 2011-02-09 DIAGNOSIS — M329 Systemic lupus erythematosus, unspecified: Secondary | ICD-10-CM | POA: Diagnosis present

## 2011-02-09 DIAGNOSIS — I10 Essential (primary) hypertension: Secondary | ICD-10-CM | POA: Diagnosis present

## 2011-02-09 DIAGNOSIS — J9600 Acute respiratory failure, unspecified whether with hypoxia or hypercapnia: Principal | ICD-10-CM | POA: Diagnosis present

## 2011-02-09 DIAGNOSIS — N179 Acute kidney failure, unspecified: Secondary | ICD-10-CM | POA: Diagnosis present

## 2011-02-09 DIAGNOSIS — T8119XA Other postprocedural shock, initial encounter: Secondary | ICD-10-CM | POA: Diagnosis present

## 2011-02-09 DIAGNOSIS — G609 Hereditary and idiopathic neuropathy, unspecified: Secondary | ICD-10-CM | POA: Diagnosis present

## 2011-02-09 DIAGNOSIS — Z86718 Personal history of other venous thrombosis and embolism: Secondary | ICD-10-CM

## 2011-02-09 DIAGNOSIS — J95822 Acute and chronic postprocedural respiratory failure: Secondary | ICD-10-CM

## 2011-02-09 DIAGNOSIS — T79A3XA Traumatic compartment syndrome of abdomen, initial encounter: Secondary | ICD-10-CM

## 2011-02-09 DIAGNOSIS — R1310 Dysphagia, unspecified: Secondary | ICD-10-CM | POA: Diagnosis present

## 2011-02-09 DIAGNOSIS — E119 Type 2 diabetes mellitus without complications: Secondary | ICD-10-CM | POA: Diagnosis present

## 2011-02-09 LAB — CBC
MCH: 30.2 pg (ref 26.0–34.0)
MCV: 82.7 fL (ref 78.0–100.0)
MCV: 83.1 fL (ref 78.0–100.0)
MCV: 83.1 fL (ref 78.0–100.0)
Platelets: 115 10*3/uL — ABNORMAL LOW (ref 150–400)
Platelets: 116 10*3/uL — ABNORMAL LOW (ref 150–400)
Platelets: 138 10*3/uL — ABNORMAL LOW (ref 150–400)
RBC: 3.36 MIL/uL — ABNORMAL LOW (ref 3.87–5.11)
RBC: 2.95 MIL/uL — ABNORMAL LOW (ref 3.87–5.11)
RBC: 3.01 MIL/uL — ABNORMAL LOW (ref 3.87–5.11)
RDW: 13.6 % (ref 11.5–15.5)
RDW: 13.8 % (ref 11.5–15.5)
WBC: 11.7 10*3/uL — ABNORMAL HIGH (ref 4.0–10.5)
WBC: 13.1 10*3/uL — ABNORMAL HIGH (ref 4.0–10.5)
WBC: 15.2 10*3/uL — ABNORMAL HIGH (ref 4.0–10.5)

## 2011-02-09 LAB — BASIC METABOLIC PANEL
BUN: 37 mg/dL — ABNORMAL HIGH (ref 6–23)
Chloride: 91 mEq/L — ABNORMAL LOW (ref 96–112)
Chloride: 93 mEq/L — ABNORMAL LOW (ref 96–112)
GFR calc Af Amer: 15 mL/min — ABNORMAL LOW (ref >60)
GFR calc non Af Amer: 13 mL/min — ABNORMAL LOW (ref >60)
Potassium: 4.5 mEq/L (ref 3.5–5.1)
Potassium: 4.9 mEq/L (ref 3.5–5.1)
Sodium: 131 mEq/L — ABNORMAL LOW (ref 135–145)
Sodium: 131 mEq/L — ABNORMAL LOW (ref 135–145)

## 2011-02-09 LAB — PROTIME-INR
INR: 1.24 (ref 0.00–1.49)
Prothrombin Time: 15.8 seconds — ABNORMAL HIGH (ref 11.6–15.2)

## 2011-02-09 LAB — COMPREHENSIVE METABOLIC PANEL
BUN: 38 mg/dL — ABNORMAL HIGH (ref 6–23)
CO2: 22 mEq/L (ref 19–32)
Calcium: 7.6 mg/dL — ABNORMAL LOW (ref 8.4–10.5)
Chloride: 94 mEq/L — ABNORMAL LOW (ref 96–112)
Creatinine, Ser: 3.19 mg/dL — ABNORMAL HIGH (ref 0.4–1.2)
GFR calc non Af Amer: 16 mL/min — ABNORMAL LOW (ref >60)
Glucose, Bld: 105 mg/dL — ABNORMAL HIGH (ref 70–99)
Total Bilirubin: 0.9 mg/dL (ref 0.3–1.2)

## 2011-02-09 LAB — PRO B NATRIURETIC PEPTIDE: Pro B Natriuretic peptide (BNP): 6843 pg/mL — ABNORMAL HIGH (ref 0–125)

## 2011-02-09 LAB — GLUCOSE, CAPILLARY
Glucose-Capillary: 118 mg/dL — ABNORMAL HIGH (ref 70–99)
Glucose-Capillary: 56 mg/dL — ABNORMAL LOW (ref 70–99)
Glucose-Capillary: 103 mg/dL — ABNORMAL HIGH (ref 70–99)
Glucose-Capillary: 119 mg/dL — ABNORMAL HIGH (ref 70–99)
Glucose-Capillary: 121 mg/dL — ABNORMAL HIGH (ref 70–99)
Glucose-Capillary: 117 mg/dL — ABNORMAL HIGH (ref 70–99)

## 2011-02-09 LAB — TYPE AND SCREEN
ABO/RH(D): B POS
ABO/RH(D): B POS
Antibody Screen: NEGATIVE
Antibody Screen: NEGATIVE
Unit division: 0

## 2011-02-09 LAB — PREPARE FRESH FROZEN PLASMA
Unit division: 0
Unit division: 0

## 2011-02-09 LAB — DIFFERENTIAL
Basophils Absolute: 0.1 10*3/uL (ref 0.0–0.1)
Basophils Relative: 1 % (ref 0–1)
Basophils Relative: 0 % (ref 0–1)
Eosinophils Absolute: 0 10*3/uL (ref 0.0–0.7)
Eosinophils Absolute: 0 10*3/uL (ref 0.0–0.7)
Eosinophils Relative: 0 % (ref 0–5)
Lymphocytes Relative: 13 % (ref 12–46)
Lymphocytes Relative: 11 % — ABNORMAL LOW (ref 12–46)
Lymphs Abs: 1.6 10*3/uL (ref 0.7–4.0)
Monocytes Absolute: 0.7 10*3/uL (ref 0.1–1.0)
Monocytes Absolute: 0.9 10*3/uL (ref 0.1–1.0)
Monocytes Absolute: 0.9 10*3/uL (ref 0.1–1.0)
Neutrophils Relative %: 80 % — ABNORMAL HIGH (ref 43–77)
Neutrophils Relative %: 81 % — ABNORMAL HIGH (ref 43–77)
Neutrophils Relative %: 83 % — ABNORMAL HIGH (ref 43–77)
Smear Review: ADEQUATE

## 2011-02-09 LAB — MRSA PCR SCREENING: MRSA by PCR: NEGATIVE

## 2011-02-09 LAB — CORTISOL: Cortisol, Plasma: 28.9 ug/dL

## 2011-02-10 ENCOUNTER — Inpatient Hospital Stay (HOSPITAL_COMMUNITY): Payer: Medicaid Other

## 2011-02-10 LAB — RENAL FUNCTION PANEL
Albumin: 2.6 g/dL — ABNORMAL LOW (ref 3.5–5.2)
Chloride: 94 mEq/L — ABNORMAL LOW (ref 96–112)
GFR calc Af Amer: 8 mL/min — ABNORMAL LOW (ref >60)
GFR calc non Af Amer: 7 mL/min — ABNORMAL LOW (ref >60)
Potassium: 5.9 mEq/L — ABNORMAL HIGH (ref 3.5–5.1)

## 2011-02-10 LAB — BLOOD GAS, ARTERIAL
Acid-base deficit: 5.6 mmol/L — ABNORMAL HIGH (ref 0.0–2.0)
Bicarbonate: 20.4 meq/L (ref 20.0–24.0)
Drawn by: 31297
FIO2: 0.5 %
MECHVT: 400 mL
O2 Saturation: 93.1 %
PEEP: 5 cmH2O
Patient temperature: 98.6
RATE: 20 {breaths}/min
TCO2: 21.8 mmol/L (ref 0–100)
pCO2 arterial: 47.5 mmHg — ABNORMAL HIGH (ref 35.0–45.0)
pH, Arterial: 7.256 — ABNORMAL LOW (ref 7.350–7.400)
pO2, Arterial: 77 mmHg — ABNORMAL LOW (ref 80.0–100.0)

## 2011-02-10 LAB — COMPREHENSIVE METABOLIC PANEL
ALT: 64 U/L — ABNORMAL HIGH (ref 0–35)
Alkaline Phosphatase: 50 U/L (ref 39–117)
BUN: 60 mg/dL — ABNORMAL HIGH (ref 6–23)
CO2: 19 mEq/L (ref 19–32)
GFR calc non Af Amer: 9 mL/min — ABNORMAL LOW (ref >60)
Glucose, Bld: 125 mg/dL — ABNORMAL HIGH (ref 70–99)
Potassium: 4.9 mEq/L (ref 3.5–5.1)
Sodium: 131 mEq/L — ABNORMAL LOW (ref 135–145)
Total Bilirubin: 0.7 mg/dL (ref 0.3–1.2)

## 2011-02-10 LAB — GLUCOSE, CAPILLARY
Glucose-Capillary: 134 mg/dL — ABNORMAL HIGH (ref 70–99)
Glucose-Capillary: 110 mg/dL — ABNORMAL HIGH (ref 70–99)
Glucose-Capillary: 139 mg/dL — ABNORMAL HIGH (ref 70–99)
Glucose-Capillary: 121 mg/dL — ABNORMAL HIGH (ref 70–99)

## 2011-02-10 LAB — DIC (DISSEMINATED INTRAVASCULAR COAGULATION)PANEL
D-Dimer, Quant: 3.22 ug/mL-FEU — ABNORMAL HIGH (ref 0.00–0.48)
INR: 1.15 (ref 0.00–1.49)
Prothrombin Time: 14.9 seconds (ref 11.6–15.2)

## 2011-02-10 LAB — CBC
MCHC: 36.3 g/dL — ABNORMAL HIGH (ref 30.0–36.0)
Platelets: 136 10*3/uL — ABNORMAL LOW (ref 150–400)
RDW: 14.1 % (ref 11.5–15.5)

## 2011-02-10 LAB — PHOSPHORUS: Phosphorus: 6.1 mg/dL — ABNORMAL HIGH (ref 2.3–4.6)

## 2011-02-10 LAB — POCT I-STAT 3, ART BLOOD GAS (G3+)
Acid-base deficit: 7 mmol/L — ABNORMAL HIGH (ref 0.0–2.0)
Acid-base deficit: 6 mmol/L — ABNORMAL HIGH (ref 0.0–2.0)
Bicarbonate: 20.6 mEq/L (ref 20.0–24.0)
Bicarbonate: 22.2 mEq/L (ref 20.0–24.0)
Bicarbonate: 22.4 mEq/L (ref 20.0–24.0)
O2 Saturation: 97 %
O2 Saturation: 96 %
O2 Saturation: 96 %
Patient temperature: 35.9
TCO2: 24 mmol/L (ref 0–100)
TCO2: 24 mmol/L (ref 0–100)
pCO2 arterial: 25.6 mmHg — ABNORMAL LOW (ref 35.0–45.0)
pO2, Arterial: 83 mmHg (ref 80.0–100.0)
pO2, Arterial: 98 mmHg (ref 80.0–100.0)

## 2011-02-10 LAB — ABO/RH: ABO/RH(D): B POS

## 2011-02-10 LAB — URINE CULTURE
Colony Count: NO GROWTH
Culture  Setup Time: 201205260050
Culture: NO GROWTH
Special Requests: NEGATIVE

## 2011-02-10 LAB — PREPARE RBC (CROSSMATCH)

## 2011-02-10 LAB — HEPARIN LEVEL (UNFRACTIONATED): Heparin Unfractionated: 0.62 [IU]/mL (ref 0.30–0.70)

## 2011-02-11 ENCOUNTER — Inpatient Hospital Stay (HOSPITAL_COMMUNITY): Payer: Medicaid Other

## 2011-02-11 DIAGNOSIS — E872 Acidosis: Secondary | ICD-10-CM

## 2011-02-11 DIAGNOSIS — J9600 Acute respiratory failure, unspecified whether with hypoxia or hypercapnia: Secondary | ICD-10-CM

## 2011-02-11 DIAGNOSIS — R894 Abnormal immunological findings in specimens from other organs, systems and tissues: Secondary | ICD-10-CM

## 2011-02-11 DIAGNOSIS — R578 Other shock: Secondary | ICD-10-CM

## 2011-02-11 DIAGNOSIS — D699 Hemorrhagic condition, unspecified: Secondary | ICD-10-CM

## 2011-02-11 DIAGNOSIS — I469 Cardiac arrest, cause unspecified: Secondary | ICD-10-CM

## 2011-02-11 LAB — GLUCOSE, CAPILLARY
Glucose-Capillary: 72 mg/dL (ref 70–99)
Glucose-Capillary: 118 mg/dL — ABNORMAL HIGH (ref 70–99)
Glucose-Capillary: 104 mg/dL — ABNORMAL HIGH (ref 70–99)
Glucose-Capillary: 87 mg/dL (ref 70–99)

## 2011-02-11 LAB — POCT I-STAT 3, ART BLOOD GAS (G3+)
O2 Saturation: 99 %
pCO2 arterial: 51.3 mmHg — ABNORMAL HIGH (ref 35.0–45.0)
pO2, Arterial: 142 mmHg — ABNORMAL HIGH (ref 80.0–100.0)

## 2011-02-11 LAB — RENAL FUNCTION PANEL
BUN: 53 mg/dL — ABNORMAL HIGH (ref 6–23)
CO2: 22 mEq/L (ref 19–32)
CO2: 26 mEq/L (ref 19–32)
Chloride: 95 mEq/L — ABNORMAL LOW (ref 96–112)
GFR calc non Af Amer: 14 mL/min — ABNORMAL LOW (ref >60)
Glucose, Bld: 127 mg/dL — ABNORMAL HIGH (ref 70–99)
Glucose, Bld: 106 mg/dL — ABNORMAL HIGH (ref 70–99)
Phosphorus: 5.3 mg/dL — ABNORMAL HIGH (ref 2.3–4.6)
Potassium: 5.3 mEq/L — ABNORMAL HIGH (ref 3.5–5.1)
Potassium: 4.5 mEq/L (ref 3.5–5.1)
Sodium: 134 mEq/L — ABNORMAL LOW (ref 135–145)

## 2011-02-11 LAB — DIC (DISSEMINATED INTRAVASCULAR COAGULATION)PANEL
D-Dimer, Quant: 9.44 ug/mL-FEU — ABNORMAL HIGH (ref 0.00–0.48)
Fibrinogen: 669 mg/dL — ABNORMAL HIGH (ref 204–475)
Prothrombin Time: 14.3 seconds (ref 11.6–15.2)
aPTT: 24 seconds (ref 24–37)

## 2011-02-11 LAB — CBC
Hemoglobin: 8.4 g/dL — ABNORMAL LOW (ref 12.0–15.0)
MCH: 30.3 pg (ref 26.0–34.0)
RBC: 2.77 MIL/uL — ABNORMAL LOW (ref 3.87–5.11)

## 2011-02-12 ENCOUNTER — Inpatient Hospital Stay (HOSPITAL_COMMUNITY): Payer: Medicaid Other

## 2011-02-12 DIAGNOSIS — R578 Other shock: Secondary | ICD-10-CM

## 2011-02-12 DIAGNOSIS — E872 Acidosis: Secondary | ICD-10-CM

## 2011-02-12 DIAGNOSIS — I469 Cardiac arrest, cause unspecified: Secondary | ICD-10-CM

## 2011-02-12 DIAGNOSIS — J9600 Acute respiratory failure, unspecified whether with hypoxia or hypercapnia: Secondary | ICD-10-CM

## 2011-02-12 LAB — CBC
HCT: 19.6 % — ABNORMAL LOW (ref 36.0–46.0)
Hemoglobin: 6.7 g/dL — CL (ref 12.0–15.0)
MCHC: 34.2 g/dL (ref 30.0–36.0)
MCV: 86.8 fL (ref 78.0–100.0)
Platelets: 184 10*3/uL (ref 150–400)
RBC: 2.22 MIL/uL — ABNORMAL LOW (ref 3.87–5.11)
RDW: 15.8 % — ABNORMAL HIGH (ref 11.5–15.5)
WBC: 13.3 10*3/uL — ABNORMAL HIGH (ref 4.0–10.5)

## 2011-02-12 LAB — GLUCOSE, CAPILLARY
Glucose-Capillary: 82 mg/dL (ref 70–99)
Glucose-Capillary: 89 mg/dL (ref 70–99)

## 2011-02-12 LAB — POCT I-STAT 3, ART BLOOD GAS (G3+)
O2 Saturation: 87 %
Patient temperature: 97
pCO2 arterial: 46 mmHg — ABNORMAL HIGH (ref 35.0–45.0)
pH, Arterial: 7.372 (ref 7.350–7.400)

## 2011-02-12 LAB — RENAL FUNCTION PANEL
Albumin: 2.8 g/dL — ABNORMAL LOW (ref 3.5–5.2)
BUN: 44 mg/dL — ABNORMAL HIGH (ref 6–23)
GFR calc Af Amer: 17 mL/min — ABNORMAL LOW (ref >60)
Phosphorus: 5.1 mg/dL — ABNORMAL HIGH (ref 2.3–4.6)
Potassium: 4 mEq/L (ref 3.5–5.1)

## 2011-02-12 LAB — DIC (DISSEMINATED INTRAVASCULAR COAGULATION)PANEL
INR: 1.09 (ref 0.00–1.49)
Smear Review: NONE SEEN

## 2011-02-12 LAB — PHOSPHORUS: Phosphorus: 4.4 mg/dL (ref 2.3–4.6)

## 2011-02-12 LAB — COMPREHENSIVE METABOLIC PANEL
BUN: 38 mg/dL — ABNORMAL HIGH (ref 6–23)
CO2: 27 mEq/L (ref 19–32)
Calcium: 7.8 mg/dL — ABNORMAL LOW (ref 8.4–10.5)
Creatinine, Ser: 3.04 mg/dL — ABNORMAL HIGH (ref 0.4–1.2)
GFR calc non Af Amer: 16 mL/min — ABNORMAL LOW (ref >60)
Glucose, Bld: 102 mg/dL — ABNORMAL HIGH (ref 70–99)

## 2011-02-12 LAB — MAGNESIUM: Magnesium: 2.7 mg/dL — ABNORMAL HIGH (ref 1.5–2.5)

## 2011-02-12 MED ORDER — IOHEXOL 300 MG/ML  SOLN
100.0000 mL | Freq: Once | INTRAMUSCULAR | Status: AC | PRN
  Administered 2011-02-12: 25 mL via INTRAVENOUS

## 2011-02-13 ENCOUNTER — Inpatient Hospital Stay (HOSPITAL_COMMUNITY): Payer: Medicaid Other

## 2011-02-13 DIAGNOSIS — R578 Other shock: Secondary | ICD-10-CM

## 2011-02-13 DIAGNOSIS — E872 Acidosis: Secondary | ICD-10-CM

## 2011-02-13 DIAGNOSIS — I469 Cardiac arrest, cause unspecified: Secondary | ICD-10-CM

## 2011-02-13 DIAGNOSIS — J9600 Acute respiratory failure, unspecified whether with hypoxia or hypercapnia: Secondary | ICD-10-CM

## 2011-02-13 LAB — CROSSMATCH: Unit division: 0

## 2011-02-13 LAB — RENAL FUNCTION PANEL
CO2: 25 mEq/L (ref 19–32)
Chloride: 97 mEq/L (ref 96–112)
Glucose, Bld: 118 mg/dL — ABNORMAL HIGH (ref 70–99)
Potassium: 4 mEq/L (ref 3.5–5.1)
Sodium: 135 mEq/L (ref 135–145)

## 2011-02-13 LAB — GLUCOSE, CAPILLARY
Glucose-Capillary: 100 mg/dL — ABNORMAL HIGH (ref 70–99)
Glucose-Capillary: 115 mg/dL — ABNORMAL HIGH (ref 70–99)
Glucose-Capillary: 128 mg/dL — ABNORMAL HIGH (ref 70–99)

## 2011-02-13 LAB — POCT I-STAT 3, ART BLOOD GAS (G3+)
TCO2: 26 mmol/L (ref 0–100)
pCO2 arterial: 39.8 mmHg (ref 35.0–45.0)
pH, Arterial: 7.406 — ABNORMAL HIGH (ref 7.350–7.400)

## 2011-02-13 LAB — CBC
HCT: 21.7 % — ABNORMAL LOW (ref 36.0–46.0)
Hemoglobin: 7.5 g/dL — ABNORMAL LOW (ref 12.0–15.0)
WBC: 11.7 10*3/uL — ABNORMAL HIGH (ref 4.0–10.5)

## 2011-02-13 LAB — MAGNESIUM: Magnesium: 2.9 mg/dL — ABNORMAL HIGH (ref 1.5–2.5)

## 2011-02-13 LAB — ALT: ALT: 64 U/L — ABNORMAL HIGH (ref 0–35)

## 2011-02-13 LAB — HEPATITIS B CORE ANTIBODY, TOTAL: Hep B Core Total Ab: NEGATIVE

## 2011-02-14 ENCOUNTER — Inpatient Hospital Stay (HOSPITAL_COMMUNITY): Payer: Medicaid Other

## 2011-02-14 DIAGNOSIS — N179 Acute kidney failure, unspecified: Secondary | ICD-10-CM

## 2011-02-14 LAB — BLOOD GAS, ARTERIAL
Bicarbonate: 27.2 mEq/L — ABNORMAL HIGH (ref 20.0–24.0)
MECHVT: 300 mL
PEEP: 5 cmH2O
Patient temperature: 98.6
TCO2: 28.6 mmol/L (ref 0–100)
pCO2 arterial: 45 mmHg (ref 35.0–45.0)
pH, Arterial: 7.398 (ref 7.350–7.400)

## 2011-02-14 LAB — GLUCOSE, CAPILLARY
Glucose-Capillary: 110 mg/dL — ABNORMAL HIGH (ref 70–99)
Glucose-Capillary: 114 mg/dL — ABNORMAL HIGH (ref 70–99)
Glucose-Capillary: 120 mg/dL — ABNORMAL HIGH (ref 70–99)

## 2011-02-14 LAB — CULTURE, BLOOD (ROUTINE X 2): Culture: NO GROWTH

## 2011-02-14 LAB — BASIC METABOLIC PANEL
Calcium: 8.7 mg/dL (ref 8.4–10.5)
Creatinine, Ser: 4.24 mg/dL — ABNORMAL HIGH (ref 0.4–1.2)
GFR calc Af Amer: 14 mL/min — ABNORMAL LOW (ref >60)
GFR calc non Af Amer: 11 mL/min — ABNORMAL LOW (ref >60)
Sodium: 140 mEq/L (ref 135–145)

## 2011-02-14 LAB — PROCALCITONIN: Procalcitonin: 5.77 ng/mL

## 2011-02-14 LAB — MAGNESIUM: Magnesium: 2.5 mg/dL (ref 1.5–2.5)

## 2011-02-14 LAB — CBC
MCH: 29.2 pg (ref 26.0–34.0)
MCHC: 33.2 g/dL (ref 30.0–36.0)
Platelets: 206 10*3/uL (ref 150–400)

## 2011-02-15 ENCOUNTER — Inpatient Hospital Stay (HOSPITAL_COMMUNITY): Payer: Medicaid Other

## 2011-02-15 LAB — COMPREHENSIVE METABOLIC PANEL
Albumin: 2.6 g/dL — ABNORMAL LOW (ref 3.5–5.2)
Alkaline Phosphatase: 88 U/L (ref 39–117)
BUN: 37 mg/dL — ABNORMAL HIGH (ref 6–23)
Creatinine, Ser: 4.07 mg/dL — ABNORMAL HIGH (ref 0.4–1.2)
GFR calc Af Amer: 14 mL/min — ABNORMAL LOW (ref >60)
Potassium: 3.3 mEq/L — ABNORMAL LOW (ref 3.5–5.1)
Total Protein: 7 g/dL (ref 6.0–8.3)

## 2011-02-15 LAB — DIFFERENTIAL
Basophils Absolute: 0.2 10*3/uL — ABNORMAL HIGH (ref 0.0–0.1)
Eosinophils Absolute: 0.5 10*3/uL (ref 0.0–0.7)
Lymphocytes Relative: 10 % — ABNORMAL LOW (ref 12–46)
Neutro Abs: 12.8 10*3/uL — ABNORMAL HIGH (ref 1.7–7.7)
Neutrophils Relative %: 78 % — ABNORMAL HIGH (ref 43–77)

## 2011-02-15 LAB — CBC
MCV: 89.5 fL (ref 78.0–100.0)
Platelets: 261 10*3/uL (ref 150–400)
RDW: 15.2 % (ref 11.5–15.5)
WBC: 16.4 10*3/uL — ABNORMAL HIGH (ref 4.0–10.5)

## 2011-02-15 LAB — GLUCOSE, CAPILLARY
Glucose-Capillary: 102 mg/dL — ABNORMAL HIGH (ref 70–99)
Glucose-Capillary: 116 mg/dL — ABNORMAL HIGH (ref 70–99)
Glucose-Capillary: 121 mg/dL — ABNORMAL HIGH (ref 70–99)
Glucose-Capillary: 133 mg/dL — ABNORMAL HIGH (ref 70–99)

## 2011-02-15 LAB — PHOSPHORUS: Phosphorus: 3.8 mg/dL (ref 2.3–4.6)

## 2011-02-16 ENCOUNTER — Inpatient Hospital Stay (HOSPITAL_COMMUNITY): Payer: Medicaid Other

## 2011-02-16 LAB — BLOOD GAS, ARTERIAL
Bicarbonate: 25.6 mEq/L — ABNORMAL HIGH (ref 20.0–24.0)
FIO2: 0.4 %
Mode: POSITIVE
O2 Saturation: 96.7 %
Patient temperature: 98.8
Pressure support: 5 cmH2O
TCO2: 27.4 mmol/L (ref 0–100)
pH, Arterial: 7.276 — ABNORMAL LOW (ref 7.350–7.400)

## 2011-02-16 LAB — BASIC METABOLIC PANEL
BUN: 64 mg/dL — ABNORMAL HIGH (ref 6–23)
CO2: 27 mEq/L (ref 19–32)
Chloride: 98 mEq/L (ref 96–112)
Creatinine, Ser: 6.31 mg/dL — ABNORMAL HIGH (ref 0.4–1.2)
Potassium: 3.4 mEq/L — ABNORMAL LOW (ref 3.5–5.1)

## 2011-02-16 LAB — GLUCOSE, CAPILLARY
Glucose-Capillary: 132 mg/dL — ABNORMAL HIGH (ref 70–99)
Glucose-Capillary: 141 mg/dL — ABNORMAL HIGH (ref 70–99)

## 2011-02-16 LAB — MAGNESIUM: Magnesium: 2.9 mg/dL — ABNORMAL HIGH (ref 1.5–2.5)

## 2011-02-16 LAB — CBC
Hemoglobin: 8.8 g/dL — ABNORMAL LOW (ref 12.0–15.0)
MCH: 29.2 pg (ref 26.0–34.0)
MCV: 88.7 fL (ref 78.0–100.0)
Platelets: 378 10*3/uL (ref 150–400)
RBC: 3.01 MIL/uL — ABNORMAL LOW (ref 3.87–5.11)
WBC: 19.2 10*3/uL — ABNORMAL HIGH (ref 4.0–10.5)

## 2011-02-17 ENCOUNTER — Inpatient Hospital Stay (HOSPITAL_COMMUNITY): Payer: Medicaid Other

## 2011-02-17 DIAGNOSIS — R578 Other shock: Secondary | ICD-10-CM

## 2011-02-17 DIAGNOSIS — J9600 Acute respiratory failure, unspecified whether with hypoxia or hypercapnia: Secondary | ICD-10-CM

## 2011-02-17 DIAGNOSIS — N179 Acute kidney failure, unspecified: Secondary | ICD-10-CM

## 2011-02-17 LAB — RENAL FUNCTION PANEL
Albumin: 2.7 g/dL — ABNORMAL LOW (ref 3.5–5.2)
BUN: 57 mg/dL — ABNORMAL HIGH (ref 6–23)
Calcium: 8.7 mg/dL (ref 8.4–10.5)
Creatinine, Ser: 6.04 mg/dL — ABNORMAL HIGH (ref 0.4–1.2)
GFR calc Af Amer: 9 mL/min — ABNORMAL LOW (ref >60)
Phosphorus: 5 mg/dL — ABNORMAL HIGH (ref 2.3–4.6)

## 2011-02-17 LAB — POCT I-STAT 3, ART BLOOD GAS (G3+)
Acid-Base Excess: 1 mmol/L (ref 0.0–2.0)
O2 Saturation: 97 %
Patient temperature: 37.3
pO2, Arterial: 91 mmHg (ref 80.0–100.0)

## 2011-02-17 LAB — CBC
MCH: 29.2 pg (ref 26.0–34.0)
MCHC: 33.3 g/dL (ref 30.0–36.0)
MCV: 87.7 fL (ref 78.0–100.0)
Platelets: 380 10*3/uL (ref 150–400)
RDW: 14.8 % (ref 11.5–15.5)

## 2011-02-17 LAB — GLUCOSE, CAPILLARY: Glucose-Capillary: 135 mg/dL — ABNORMAL HIGH (ref 70–99)

## 2011-02-17 NOTE — Consult Note (Addendum)
Diana Mack, Diana Mack              ACCOUNT NO.:  [id number]  MEDICAL RECORD NO.:  [id number]           PATIENT TYPE:  I  LOCATION:  1228                         FACILITY:  [facility]  PHYSICIAN:  Maree Krabbe, M.D.DATE OF BIRTH:  07/27/1961  DATE OF CONSULTATION: DATE OF DISCHARGE:  02/09/2011                                CONSULTATION   REASON FOR CONSULTATION:  Possible need for CVVH.  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  She underwent attempted TAH-BSO on Feb 05, 2011.  Surgery was aborted due to frozen pelvis and biopsies were taken. 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 The patient did not have any immediate complications after the surgery.  Lovenox was restarted 24 hours after surgery followed by Coumadin on Feb 07, 2011.  The patient's hemoglobin slowly drifted down after anticoagulation resumption.  She had a witnessed arrest with CPR for 15 minutes on Feb 08, 2011.  Her hemoglobin was found to be 3.5.  She received 4 units of PRBC's.  She was in shock. Her urine output has reduced and creatinine has  trended up to 3.77 today.  She was transferred from [facility] to [facility] for possible need for hemodialysis.  The patient is currently sedated and unable to provide history.  REVIEW OF SYSTEMS:  Unable to obtain.  FAMILY HISTORY:  Unable to obtain.  SOCIAL HISTORY:  Unable to obtain.  PHYSICAL EXAMINATION:  VITAL SIGNS:  Temperature 99.3, pulse 108, blood pressure 101/65, respiratory rate 13, oxygen saturation 98% on 40% PRBC. I's and O's, this is probably inaccurate as the patient has just arrived to the hospital.  Her input is 64 and output is 30 mL.  Weight is 101.6 kg. GENERAL:  Lying in bed, sedated, tolerating  ventilator. CVS:  S1-S2 normal. RESPIRATORY:  Bilateral basilar crackles. ABDOMEN:  Distended and tensed.  Laparotomy scar has clean dressing. EXTREMITIES:  SCDs 1+ edema bilaterally.  PAST MEDICAL HISTORY: 1. Admitted on January 2012 to [facility] for bilateral DVT. 2. Lupus anticoagulant, on Coumadin since January 2012.  Followed by     Dr. Dalene Carrow. 3. 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 4. Hypertension. 5. Obstructive uropathy for which the patient was discharged with a     Foley catheter in January 2012 and was followed by [facility].  INPATIENT MEDICATION: 1. Zosyn. 2. Protonix. 3. Solu-Cortef 50 mg IV twice a day. 4. Midazolam/Versed sedation.  LABS:  Sodium 131, potassium 4.1, chloride 93, bicarb 22, BUN 42, creatinine 3.77, which has trended up from 3.19 the day before and normal creatinine at admission at [facility].  White count 15.2 which has gone up from 11.7, hemoglobin 9.1, which has dropped down from 10.2, platelet count 138.  Heparin level 0.96.  PTT 36, PT 15.8, INR 1.24.  IMAGING STUDY: 1. Chest x-ray shows bilateral atelectasis, right-sided pleural     effusion, endotracheal  tube, and right IJ catheter.  Ultrasound of     the abdomen shows mild-to-moderate bilateral hydronephrosis, small     amount of ascites. 2. Abdominal x-ray on May 25 shows gas-filled colonic loops.  ASSESSMENT AND PLAN: 1. Acute renal failure secondary to shock with ATN, and/or ARK from abdominal     compartment syndrome.     Her K is borderline high.  Her volume     status is mildly hypervolemic.  Her electrolytes at this time look     okay. Her mental function cannot be assessed     given sedation.  The patient does not have any absolute indication     for hemodialysis at this time, but we will continue to follow her     electrolytes and volume status very closely and be might consider     starting CVVH in next 24-48  hours. 2. Hemorrhagic shock- BP is currently maintained, off pressors. 3. Respiratory failure.  The patient is on full ventilator support per     Critical Care Medicine.  Zosyn was started for gram-negative     empiric coverage.  The patient is on stress dose steroids. 4. Severe anemia.  The patient's hemoglobin is trending down again     after 4 units of transfusion she received yesterday.  Although,     there is no indication for transfusion at this time. 5. Intra-abdominal bleed.  [facility] is following and have noted     surgery at this time is too risky in the setting of     coagulopathy and possibility of leaving the abdomen open after a     repeat laparotomy given high intra-abdominal pressures. 6. Lupus anticoagulant.  The patient is currently off all     anticoagulants.  She is at high risk of thrombosis.  Thank you for this consultation.  We will follow the patient closely in the hospital.  We will consider starting continuous renal replacement therapy within the next 24-48 hours.  Please call for any further questions.     Bethel Born, MD   ______________________________ Maree Krabbe, M.D. MD/MEDQ  D:  02/10/2011  T:  02/11/2011  Job:  [id number]  Electronically Signed by Bethel Born  on 02/16/2011 04:02:44 PM Electronically Signed by Delano Metz M.D. on 02/17/2011 12:35:15 PM

## 2011-02-18 ENCOUNTER — Inpatient Hospital Stay (HOSPITAL_COMMUNITY): Payer: Medicaid Other

## 2011-02-18 DIAGNOSIS — E872 Acidosis: Secondary | ICD-10-CM

## 2011-02-18 LAB — CBC
HCT: 24.7 % — ABNORMAL LOW (ref 36.0–46.0)
Hemoglobin: 8.2 g/dL — ABNORMAL LOW (ref 12.0–15.0)
MCH: 29.1 pg (ref 26.0–34.0)
RBC: 2.82 MIL/uL — ABNORMAL LOW (ref 3.87–5.11)

## 2011-02-18 LAB — GLUCOSE, CAPILLARY
Glucose-Capillary: 126 mg/dL — ABNORMAL HIGH (ref 70–99)
Glucose-Capillary: 103 mg/dL — ABNORMAL HIGH (ref 70–99)

## 2011-02-18 LAB — RENAL FUNCTION PANEL
CO2: 24 mEq/L (ref 19–32)
Chloride: 98 mEq/L (ref 96–112)
Creatinine, Ser: 8.02 mg/dL — ABNORMAL HIGH (ref 0.4–1.2)
GFR calc Af Amer: 6 mL/min — ABNORMAL LOW (ref >60)
GFR calc non Af Amer: 5 mL/min — ABNORMAL LOW (ref >60)
Sodium: 142 mEq/L (ref 135–145)

## 2011-02-19 ENCOUNTER — Inpatient Hospital Stay (HOSPITAL_COMMUNITY): Payer: Medicaid Other

## 2011-02-19 DIAGNOSIS — R578 Other shock: Secondary | ICD-10-CM

## 2011-02-19 DIAGNOSIS — E872 Acidosis, unspecified: Secondary | ICD-10-CM

## 2011-02-19 DIAGNOSIS — J9600 Acute respiratory failure, unspecified whether with hypoxia or hypercapnia: Secondary | ICD-10-CM

## 2011-02-19 DIAGNOSIS — N179 Acute kidney failure, unspecified: Secondary | ICD-10-CM

## 2011-02-19 LAB — CLOSTRIDIUM DIFFICILE BY PCR: Toxigenic C. Difficile by PCR: NEGATIVE

## 2011-02-19 LAB — BASIC METABOLIC PANEL
Calcium: 9.1 mg/dL (ref 8.4–10.5)
GFR calc Af Amer: 9 mL/min — ABNORMAL LOW (ref >60)
GFR calc non Af Amer: 7 mL/min — ABNORMAL LOW (ref >60)
Glucose, Bld: 112 mg/dL — ABNORMAL HIGH (ref 70–99)
Potassium: 3.4 mEq/L — ABNORMAL LOW (ref 3.5–5.1)
Sodium: 140 mEq/L (ref 135–145)

## 2011-02-19 LAB — GLUCOSE, CAPILLARY
Glucose-Capillary: 105 mg/dL — ABNORMAL HIGH (ref 70–99)
Glucose-Capillary: 109 mg/dL — ABNORMAL HIGH (ref 70–99)
Glucose-Capillary: 110 mg/dL — ABNORMAL HIGH (ref 70–99)

## 2011-02-19 LAB — CBC
HCT: 23.6 % — ABNORMAL LOW (ref 36.0–46.0)
MCHC: 33.5 g/dL (ref 30.0–36.0)
Platelets: 412 10*3/uL — ABNORMAL HIGH (ref 150–400)
RDW: 14.6 % (ref 11.5–15.5)
WBC: 19.7 10*3/uL — ABNORMAL HIGH (ref 4.0–10.5)

## 2011-02-19 LAB — PHOSPHORUS: Phosphorus: 6.5 mg/dL — ABNORMAL HIGH (ref 2.3–4.6)

## 2011-02-19 LAB — MAGNESIUM: Magnesium: 2.8 mg/dL — ABNORMAL HIGH (ref 1.5–2.5)

## 2011-02-20 ENCOUNTER — Inpatient Hospital Stay (HOSPITAL_COMMUNITY): Payer: Medicaid Other

## 2011-02-20 DIAGNOSIS — R5381 Other malaise: Secondary | ICD-10-CM

## 2011-02-20 DIAGNOSIS — R579 Shock, unspecified: Secondary | ICD-10-CM

## 2011-02-20 LAB — CBC
HCT: 23.1 % — ABNORMAL LOW (ref 36.0–46.0)
Hemoglobin: 8 g/dL — ABNORMAL LOW (ref 12.0–15.0)
MCH: 29.7 pg (ref 26.0–34.0)
MCHC: 34.6 g/dL (ref 30.0–36.0)
MCV: 85.9 fL (ref 78.0–100.0)
Platelets: 410 10*3/uL — ABNORMAL HIGH (ref 150–400)
WBC: 17.9 10*3/uL — ABNORMAL HIGH (ref 4.0–10.5)

## 2011-02-20 LAB — BASIC METABOLIC PANEL
BUN: 103 mg/dL — ABNORMAL HIGH (ref 6–23)
Calcium: 9.4 mg/dL (ref 8.4–10.5)
Chloride: 99 mEq/L (ref 96–112)
Creatinine, Ser: 8.73 mg/dL — ABNORMAL HIGH (ref 0.4–1.2)
GFR calc Af Amer: 6 mL/min — ABNORMAL LOW (ref >60)

## 2011-02-20 LAB — PHOSPHORUS: Phosphorus: 7.3 mg/dL — ABNORMAL HIGH (ref 2.3–4.6)

## 2011-02-20 LAB — MAGNESIUM: Magnesium: 2.9 mg/dL — ABNORMAL HIGH (ref 1.5–2.5)

## 2011-02-21 ENCOUNTER — Inpatient Hospital Stay (HOSPITAL_COMMUNITY): Payer: Medicaid Other

## 2011-02-21 LAB — RENAL FUNCTION PANEL
Albumin: 2.9 g/dL — ABNORMAL LOW (ref 3.5–5.2)
BUN: 52 mg/dL — ABNORMAL HIGH (ref 6–23)
Chloride: 100 mEq/L (ref 96–112)
Creatinine, Ser: 4.64 mg/dL — ABNORMAL HIGH (ref 0.4–1.2)
Glucose, Bld: 128 mg/dL — ABNORMAL HIGH (ref 70–99)
Phosphorus: 3 mg/dL (ref 2.3–4.6)
Potassium: 3.3 mEq/L — ABNORMAL LOW (ref 3.5–5.1)

## 2011-02-21 LAB — LIPASE, BLOOD: Lipase: 660 U/L — ABNORMAL HIGH (ref 11–59)

## 2011-02-22 ENCOUNTER — Inpatient Hospital Stay (HOSPITAL_COMMUNITY)
Admission: RE | Admit: 2011-02-22 | Discharge: 2011-03-06 | DRG: 945 | Disposition: A | Discharge status: Home / Self Care | Payer: Medicaid Other | Source: Other Acute Inpatient Hospital | Attending: Physical Medicine & Rehabilitation | Admitting: Physical Medicine & Rehabilitation

## 2011-02-22 DIAGNOSIS — N179 Acute kidney failure, unspecified: Secondary | ICD-10-CM | POA: Diagnosis present

## 2011-02-22 DIAGNOSIS — R579 Shock, unspecified: Secondary | ICD-10-CM

## 2011-02-22 DIAGNOSIS — M329 Systemic lupus erythematosus, unspecified: Secondary | ICD-10-CM | POA: Diagnosis present

## 2011-02-22 DIAGNOSIS — Z5189 Encounter for other specified aftercare: Principal | ICD-10-CM

## 2011-02-22 DIAGNOSIS — R112 Nausea with vomiting, unspecified: Secondary | ICD-10-CM | POA: Diagnosis present

## 2011-02-22 DIAGNOSIS — G609 Hereditary and idiopathic neuropathy, unspecified: Secondary | ICD-10-CM | POA: Diagnosis present

## 2011-02-22 DIAGNOSIS — R5381 Other malaise: Secondary | ICD-10-CM

## 2011-02-22 DIAGNOSIS — I10 Essential (primary) hypertension: Secondary | ICD-10-CM | POA: Diagnosis present

## 2011-02-22 DIAGNOSIS — D649 Anemia, unspecified: Secondary | ICD-10-CM | POA: Diagnosis present

## 2011-02-22 LAB — CBC
HCT: 27.2 % — ABNORMAL LOW (ref 36.0–46.0)
Hemoglobin: 9.1 g/dL — ABNORMAL LOW (ref 12.0–15.0)
MCV: 86.3 fL (ref 78.0–100.0)
RDW: 14.6 % (ref 11.5–15.5)
WBC: 18.2 10*3/uL — ABNORMAL HIGH (ref 4.0–10.5)

## 2011-02-22 LAB — BASIC METABOLIC PANEL
BUN: 77 mg/dL — ABNORMAL HIGH (ref 6–23)
CO2: 23 mEq/L (ref 19–32)
Glucose, Bld: 113 mg/dL — ABNORMAL HIGH (ref 70–99)
Potassium: 3.3 mEq/L — ABNORMAL LOW (ref 3.5–5.1)
Sodium: 137 mEq/L (ref 135–145)

## 2011-02-23 DIAGNOSIS — R579 Shock, unspecified: Secondary | ICD-10-CM

## 2011-02-23 DIAGNOSIS — R5381 Other malaise: Secondary | ICD-10-CM

## 2011-02-23 LAB — CBC
HCT: 27.3 % — ABNORMAL LOW (ref 36.0–46.0)
Hemoglobin: 9.2 g/dL — ABNORMAL LOW (ref 12.0–15.0)
MCH: 29.4 pg (ref 26.0–34.0)
MCHC: 33.7 g/dL (ref 30.0–36.0)
MCV: 87.2 fL (ref 78.0–100.0)
RBC: 3.13 MIL/uL — ABNORMAL LOW (ref 3.87–5.11)

## 2011-02-23 LAB — COMPREHENSIVE METABOLIC PANEL
ALT: 22 U/L (ref 0–35)
BUN: 91 mg/dL — ABNORMAL HIGH (ref 6–23)
CO2: 23 mEq/L (ref 19–32)
Calcium: 9.2 mg/dL (ref 8.4–10.5)
Creatinine, Ser: 6.94 mg/dL — ABNORMAL HIGH (ref 0.4–1.2)
GFR calc Af Amer: 8 mL/min — ABNORMAL LOW (ref >60)
GFR calc non Af Amer: 6 mL/min — ABNORMAL LOW (ref >60)
Glucose, Bld: 129 mg/dL — ABNORMAL HIGH (ref 70–99)
Total Protein: 7.9 g/dL (ref 6.0–8.3)

## 2011-02-24 LAB — HEPATIC FUNCTION PANEL
ALT: 32 U/L (ref 0–35)
AST: 28 U/L (ref 0–37)
Albumin: 3 g/dL — ABNORMAL LOW (ref 3.5–5.2)
Alkaline Phosphatase: 109 U/L (ref 39–117)
Bilirubin, Direct: 0.1 mg/dL (ref 0.0–0.3)
Total Bilirubin: 0.6 mg/dL (ref 0.3–1.2)

## 2011-02-24 LAB — BASIC METABOLIC PANEL
BUN: 91 mg/dL — ABNORMAL HIGH (ref 6–23)
CO2: 23 mEq/L (ref 19–32)
Calcium: 9.3 mg/dL (ref 8.4–10.5)
Creatinine, Ser: 6.41 mg/dL — ABNORMAL HIGH (ref 0.4–1.2)
GFR calc non Af Amer: 7 mL/min — ABNORMAL LOW (ref >60)
Glucose, Bld: 127 mg/dL — ABNORMAL HIGH (ref 70–99)

## 2011-02-24 LAB — CBC
HCT: 27.5 % — ABNORMAL LOW (ref 36.0–46.0)
Hemoglobin: 9.4 g/dL — ABNORMAL LOW (ref 12.0–15.0)
MCV: 86.5 fL (ref 78.0–100.0)
RBC: 3.18 MIL/uL — ABNORMAL LOW (ref 3.87–5.11)
RDW: 14.8 % (ref 11.5–15.5)
WBC: 15.3 10*3/uL — ABNORMAL HIGH (ref 4.0–10.5)

## 2011-02-24 NOTE — Consult Note (Addendum)
Diana, Mack              ACCOUNT NO.:  [id number]  MEDICAL RECORD NO.:  [id number]           PATIENT TYPE:  I  LOCATION:  1228                         FACILITY:  [facility]  PHYSICIAN:  Lowella Dell, M.D.DATE OF BIRTH:  [birth year]  DATE OF CONSULTATION: DATE OF DISCHARGE:                                CONSULTATION   Diana Mack is a 50 year old [place] woman known to Dr. Dalene Carrow for history of coagulopathy.  The patient presented in January 2012 with bilateral lower extremity DVT and was found to have a positive lupus anticoagulant.  She was treated with Coumadin with the plan being to continue anticoagulation for 1 year.  However, as part of the evaluation in January, the patient was found to have bilateral adnexal masses of unclear etiology.  I do not see a CA- 125, but apparently there was an evaluation by the gynecologic oncology group here in town, which I do not have access to.  The decision was made to consider TAH-BSO, and the patient's anticoagulants were held prior to that procedure as per Dr. Lonell Face instructions.  The patient underwent exploratory laparotomy and omental biopsies on May 22; however, Dr. Christell Constant found essentially "frozen pelvis," and the planned procedure could not be performed.  The pathology from the biopsies obtained then was benign (SZB12 - [id number] and NZB12 - [id number]), showing no atypia or cancer and also no evidence of endometriosis.  The patient apparently had an uneventful postoperative course except for a drop in her hemoglobin from 13.1 preop to 10.6 on postoperative day #1.  In the morning, this had dropped to 8.3 and that evening the patient had a witnessed arrest felt possibly due to hemorrhagic shock. The patient is currently intubated with evidence of progressing acute renal failure, absence of bowel sounds and a moderate right effusion. We are asked to consult regarding further management of the coagulopathy.  PAST MEDICAL  HISTORY:  Otherwise significant for hypertension.  She is not known to have had prior surgeries, and it is not clear to me why she had a "frozen pelvis."  FAMILY HISTORY:  Significant for a maternal aunt with history of DVT.  SOCIAL HISTORY:  The patient works as a Engineer, site.  She is divorced and has no children.  There is no history of tobacco or alcohol abuse.  ALLERGIES:  She is allergic to [facility].  CURRENT MEDICATIONS:  Include Apresoline, pantoprazole, Zosyn, fentanyl, Versed, phenylephrine, promethazine, prochlorperazine and IV fluids. She also has received 2 doses of protamine sulfate as discussed below.  PHYSICAL EXAMINATION:  The patient's current vitals show a heart rate of 106, blood pressure of 118/71 and her saturation on the ventilator is at 98%.  The patient is sedated.  I do not hear rales over the upper anterior lung fields.  Heart rate is regular, and I do not hear bowel sounds.  LABORATORY DATA:  Current labs include pH of 7.452, pCO2 of 28.8, pO2 of 91.8, hemoglobin 9.1, white cell count 15.2, MCV 83.1 and platelets 138,000.  PT/INR was 1.24, PTT was 36.  The heparin level was 0.89 yesterday and is 0.96 today.  Glucose was  103.  Sodium 132, potassium 4.6, chloride 94, CO2 22, glucose 105, BUN 38.  Creatinine was 3.19 this morning, is currently 3.77.  Liver function tests this morning showed an alkaline phosphatase of 55, AST of 124 and an ALT of 36.  Calcium was 6.3, albumin 3.2, phosphorus 7.6.  LDH in the evening of May 25 was 378. Cortisol on May 25 was 28.9.  FILMS:  Chest x-ray this morning showed mildly improved bilateral air space opacities but still poor inspiration.  Abdominal ultrasonography performed yesterday showed diffusely echogenic liver, and she is known to have hepatic steatosis.  The right kidney was normal in size with a dilated central collecting system.  Left kidney was not well visualized but measured approximately 10.3 cm again  with apparently dilated central collecting system.  There was a small amount of ascites.  IMPRESSION AND PLAN:  A 50 year old [place] woman followed by Dr. Dalene Carrow for history of lupus anticoagulant, presenting with bilateral DVTs in January 2012.  The patient was on chronic anticoagulation, but this was interrupted appropriately as per Dr. Lonell Face instructions to allow for planned TAH-BSO.  However, when the patient underwent exploratory laparotomy on May 22, frozen pelvis was noted, and the procedure had to be aborted.  Biopsies were obtained at that time. These are benign at the time of this dictation.  The patient's Lovenox was resumed on May 24, 48 hours post procedure. Coumadin was started the same day.  The INR was 0.97 preop and remains 1.24 currently.  The patient's hemoglobin however dropped from 13.1 preop to 10.6 and then 8.3 in the morning of May 25.  That evening, the patient had a witnessed arrest felt to be likely due to hemorrhagic shock.  Since that time, the patient has received 4 units of packed red cells and 4 units of fresh frozen plasma.  Her hemoglobin rose to 7.5 and is currently 9.1.  The patient's platelet count had dropped initially at 116, doubtless due to increased use, it is currently rising at 138.  The patient's fibrinogen yesterday was 342.  The patient's last dose of Lovenox was given at 6 in the morning on May 25.  Her heparin level however remains high likely secondary to her acute renal failure.  The patient received 2 doses of protamine, 45 mg each.  The PTT this morning was 36 seconds.  I have little to add to the excellent management this unfortunate patient is receiving.  I would not recommend activated factor VII in this patient with a history of hypercoagulable state.  Instead, I would continue maximal support with transfusions of packed red cells and fresh frozen plasma as needed to keep the hemoglobin greater than 9.  We  will follow the DIC panel daily.  Once the patient's heparin levels decrease to less than 0.3 and there is no further evidence of bleeding, with a stable hemoglobin for 48 hours, I would consider starting intravenous heparin at low dose since this is easier to reverse.  If the patient's bleeding persists, she may need emergent imaging and possibly reexploration.  Hopefully, this will not be necessary.  We will follow this patient with you.  I appreciate the consult.     Lowella Dell, M.D.     Ronna Polio  D:  02/09/2011  T:  02/09/2011  Job:  [id number]  cc:   Roseanna Rainbow, M.D. Fax: [phone]  Candyce Churn. Allyne Gee, M.D. Fax: [phone]  Ardeth Sportsman, MD [address] [place] [address]  Laurice Record, M.D. Fax: [phone]  Electronically Signed by Ruthann Cancer M.D. on 02/24/2011 11:45:43 AM

## 2011-02-25 LAB — BASIC METABOLIC PANEL
BUN: 86 mg/dL — ABNORMAL HIGH (ref 6–23)
CO2: 22 mEq/L (ref 19–32)
Calcium: 9 mg/dL (ref 8.4–10.5)
GFR calc non Af Amer: 8 mL/min — ABNORMAL LOW (ref >60)
Glucose, Bld: 143 mg/dL — ABNORMAL HIGH (ref 70–99)
Sodium: 138 mEq/L (ref 135–145)

## 2011-02-26 ENCOUNTER — Inpatient Hospital Stay (HOSPITAL_COMMUNITY): Payer: Medicaid Other

## 2011-02-26 ENCOUNTER — Other Ambulatory Visit (HOSPITAL_COMMUNITY): Payer: Self-pay

## 2011-02-26 DIAGNOSIS — R5381 Other malaise: Secondary | ICD-10-CM

## 2011-02-26 DIAGNOSIS — G931 Anoxic brain damage, not elsewhere classified: Secondary | ICD-10-CM

## 2011-02-26 DIAGNOSIS — R579 Shock, unspecified: Secondary | ICD-10-CM

## 2011-02-26 LAB — CBC
MCH: 29.9 pg (ref 26.0–34.0)
MCHC: 34.2 g/dL (ref 30.0–36.0)
MCV: 87.2 fL (ref 78.0–100.0)
Platelets: 393 10*3/uL (ref 150–400)
RBC: 3.35 MIL/uL — ABNORMAL LOW (ref 3.87–5.11)

## 2011-02-26 LAB — RENAL FUNCTION PANEL
CO2: 21 mEq/L (ref 19–32)
Calcium: 9.3 mg/dL (ref 8.4–10.5)
Creatinine, Ser: 3.82 mg/dL — ABNORMAL HIGH (ref 0.4–1.2)
GFR calc non Af Amer: 13 mL/min — ABNORMAL LOW (ref >60)
Glucose, Bld: 143 mg/dL — ABNORMAL HIGH (ref 70–99)
Phosphorus: 5.2 mg/dL — ABNORMAL HIGH (ref 2.3–4.6)
Sodium: 134 mEq/L — ABNORMAL LOW (ref 135–145)

## 2011-02-26 NOTE — Consult Note (Addendum)
NAMEJESLIE, LOWE              ACCOUNT NO.:  [id number]  MEDICAL RECORD NO.:  [id number]           PATIENT TYPE:  I  LOCATION:  1228                         FACILITY:  [facility]  PHYSICIAN:  Ardeth Sportsman, MD     DATE OF BIRTH:  11/13/1960  DATE OF CONSULTATION: DATE OF DISCHARGE:                                CONSULTATION   PRIMARY CARE PHYSICIAN:  Robyn N. Allyne Gee, M.D.  REQUESTING PHYSICIAN:  Roseanna Rainbow, M.D.  HEMATOLOGIST:  Laurice Record, M.D.  SURGEON:  Ardeth Sportsman, M.D.  FIRST ASSISTANT:  [facility].  REASON FOR EVALUATION:  Severe shock, most likely hemorrhagic, questionable abdominal compartment syndrome.  HISTORY OF PRESENT ILLNESS:  Ms. Tumolo is a 50 year old  obese female with the history of lupus anticoagulant, on chronic anticoagulation; history of prior PEs.  She was found to have bilateral adnexal masses. It was recommended that she undergo surgery for diagnosis and possible removal of this.  She underwent laparotomy by Dr. Nelly Rout and Dr. Antionette Char on Feb 03, 2011.  Infraumbilically, she had quite a frozen abdomen.  It sounds like supraumbilically, they were able to get in a little bit and do some dissection, but they never could really get down through pelvis.  Because of the dense adhesions, they felt it was not safe to continue.  They ruled out any evidence of any bowel injury or leak.  Blood loss was minimal.  Based on that, they closed.  She was monitored on the floor and seem to improve.  She was going to be ready for discharge today.  She was started on her full anticoagulation yesterday.  She felt lightheaded, dizzy and coded.  Her hemoglobin had drifted down postoperatively.  She coded though and found to have a hemoglobin of 3.  She was moved in the intensive care unit and critical care became involved as well.  She was transfused with 4 units of packed red blood cells and fresh frozen plasma.  Her  hemoglobin is now up to 10.5.  It was concerned that her abdominal scan confirmed and her urine output is going down and she possibly had abdominal compartment syndrome.  Dr. Tamela Oddi called for advice on management for possible abdominal compartment syndrome as well as postoperative bleeding.  PAST MEDICAL HISTORY: 1. Bilateral lower extremity deep venous thrombosis in January 2012. 2. Lupus anticoagulant, now followed by Dr. Dalene Carrow. 3. Hypertension.  PAST SURGICAL HISTORY:  Negative.  FAMILY HISTORY:  Maternal aunt with lower extremity deep venous thrombosis.  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  MEDICATIONS AT HOME:  Include: 1. Metoprolol. 2. Warfarin, followed by Dr. Dalene Carrow.  ALLERGIES:  KEFLEX gives her GI upset, but no true rashes.  REVIEW OF SYSTEMS:  Not able to be done since she is completely intubated.  PHYSICAL EXAMINATION:  VITAL SIGNS:  Her mean blood pressure is in the 90s right now.  She is on a ventilator with rate in 30s and the volume around 420 for a low-volume ARDS protocol.  PEEP is 5.  Her oxygen is 50%. GENERAL:  She is intubated and sedated. EYES:  Pupils are equal, round and reactive to light.  Sclerae are mildly injected. HEENT:  She is normocephalic.  She has a NG tube in place.  NG tube has minimal thinly bilious fluid. NECK:  No obvious masses. HEART:  Regular rate and rhythm, tachycardic with the heart rate in the 110/20s.  No obvious masses or sternal issues. BREASTS:  No nipple discharge or obvious masses. LUNGS:  Decreased breath sounds bilaterally.  No wheezes or rhonchi. ABDOMEN:  The abdominal wall skin and subcutaneous tissues are rather soft.  She is now severely distended; however, abdominal musculature seems firm.  Her Pfannenstiel incision looks clear without any large hematoma or any bloody drainage.  There is no straining problem. EXTREMITIES:  She has mild cyanosis, but no severe  clubbing or edema. NEUROLOGICAL:  She was reaching for the NG tube post her code and apparently can move all extremities to pain and discomfort, sometimes to command, but not recently.  She is under deep sedation.  LABORATORY DATA:  Laboratory values are as noted above with hemoglobin drifting from 13 to 10 to 8 and then down to 3 when she coded. Currently, it is 10.5 after receiving 4 units of blood and 4 units of FFP.  Her INR is 1.19.  Heparin level is elevated 0.89.  Her creatinine is 2.26, it was 1.33 earlier this morning.  DIAGNOSTIC STUDIES:  She has a plain film, which shows no obvious free air, although the quality is poor.  She does have gas in the colon.  ASSESSMENT AND PLAN:  Obese female with severe hemorrhagic shock post surgery, in re-anticoagulation: 1. Continue aggressive correction of coagulopathy. 2. Keep hemoglobin elevated. 3. Keep on ventilator for respiratory failure and follow. 4. Anticipate renal failure secondary to severe shock with possible     acute tubular necrosis. 5. Possible bladder compartment or abdominal compartment syndrome.  At this point, with her recent severe bleeding and coagulopathy, I think it is bad idea to take her to the operating room right now as she may have rebleeding, which could be fatal.  I would allow her to tamponade with coagulopathy and support her through this. I discussed with numerous [facility] partners.  She is not on any pressors at this time.  Her ventilatory requirements are above average, they are not severe, so we would not say that she requires urgent abdominal decompression to allow her to be able to be ventilated.  She still has plenty of reserve in the ventilator.  She is probably going into renal failure, but this is probably related to acute tubular necrosis and severe shock.  The abdominal pressure probably contributes to this, but hopefully rather that she go into renal failure that could be managed with dialysis  in the short-term, but more likely a good long recovery then risk a lethal event with emergent laparotomy.  We will follow closely.  If her hemoglobin stabilizes, hopefully, we can try to diurese her and wean her.  We will follow with you.  Given the fact that the abdomen is rather frozen, they ruled out any other major injury, but I do think that she has had a leak.  If she has had a more stable point, consider getting computer-aided tomography to see where the source of her blood loss is, if this is intraabdominal or rectus sheath as it might help guide her course.  The rectus sheath could be managed with coagulopathy control and volume and avoid washout;  however, I worry if we take her to the operating room right now, you are not going to re-close her and this should be left with an open abdomen and long-term complications from that.  I discussed this in principal with Dr. Christell Constant just before seeing her in more detail.  I will follow with this and discussed with the intensive care unit as well.     Ardeth Sportsman, MD     SCG/MEDQ  D:  02/08/2011  T:  02/09/2011  Job:  [id number]  Electronically Signed by Karie Soda MD on 02/26/2011 04:00:35 PM

## 2011-02-27 ENCOUNTER — Inpatient Hospital Stay (HOSPITAL_COMMUNITY): Payer: Medicaid Other

## 2011-02-27 LAB — RENAL FUNCTION PANEL
Albumin: 2.7 g/dL — ABNORMAL LOW (ref 3.5–5.2)
BUN: 58 mg/dL — ABNORMAL HIGH (ref 6–23)
Calcium: 8.9 mg/dL (ref 8.4–10.5)
Glucose, Bld: 138 mg/dL — ABNORMAL HIGH (ref 70–99)
Phosphorus: 4.6 mg/dL (ref 2.3–4.6)

## 2011-02-27 LAB — CBC
HCT: 26.5 % — ABNORMAL LOW (ref 36.0–46.0)
Hemoglobin: 8.9 g/dL — ABNORMAL LOW (ref 12.0–15.0)
MCH: 29 pg (ref 26.0–34.0)
MCHC: 33.6 g/dL (ref 30.0–36.0)
RDW: 14.9 % (ref 11.5–15.5)

## 2011-02-27 NOTE — H&P (Addendum)
Diana Mack, Diana Mack              ACCOUNT NO.:  [id number]  MEDICAL RECORD NO.:  [id number]  LOCATION:  3739                         FACILITY:  [facility]  PHYSICIAN:  Erick Colace, M.D.DATE OF BIRTH:  [birth year]  DATE OF ADMISSION:  02/09/2011 DATE OF DISCHARGE:                             HISTORY & PHYSICAL   REASON FOR ADMISSION:  Lumbar plexopathy and deconditioning following a hemorrhagic shock.  A 50 year old female with prior history of bilateral DVT.  She is a lupus patient with hypercoagulable state and has been on chronic Coumadin.  She also had bilateral adnexal masses, underwent exploratory laparotomy and biopsies on Feb 05, 2011, with a benign pathology.  Post procedure, her anticoagulation was resumed with combination subcu Lovenox and Coumadin.  However, the patient had a hemorrhagic event dropping hemoglobin to 3.5, transfused with 4 units, had cardiorespiratory arrest, and had hypovolemic shock injury to kidneys causing acute renal failure.  Hemodialysis was initiated and continues to be ongoing.  Her further workup did reveal a retroperitoneal hematoma and General Surgery advised conservative care.  The patient experienced swallowing difficulties and FEES on February 19, 2011, showed the patient to require nectar liquids and D3 diet to avoid aspiration.  An IVC filter was placed on Feb 12, 2011, to avoid pulmonary embolism.  No anticoagulation is planned for at least a few weeks.  PT and OT were initiated requiring moderate physical assistance and PM and R consult was advised.  The patient was seen by Dr. Riley Kill from [facility] on February 20, 2011, felt to be good rehab candidate.  PAST MEDICAL HISTORY:  As noted above, positive lupus, positive bilateral DVT, positive hypertension.  SOCIAL HISTORY:  She worked as a Engineer, site for [employer], [employer].  She lives in a home with 3 steps to enter.  She has a sister who can assist her.  Her prior functional status  was independent.  HOME MEDICATIONS:  Coumadin and metoprolol.  FAMILY HISTORY:  Negative.  REVIEW OF SYSTEMS:  Poor sleep last night.  Positive nausea, vomiting last night.  Review of systems otherwise negative.  PHYSICAL EXAMINATION:  VITAL SIGNS:  Temperature 98, blood pressure 120/70, pulse 70, and respirations 17. GENERAL:  This is an overweight female in no acute distress.  Mood and affect are appropriate. HEENT:  Her eyes are anicteric, noninjected.  External ENT normal. NECK:  Supple without adenopathy.  Does have a left IJ cath site which shows no evidence of hemorrhage or erythema. RESPIRATORY:  Effort is good.  Lungs are clear to auscultation. HEART:  Regular rate and rhythm.  No rubs, murmurs, or extra sounds. ABDOMEN:  Positive bowel sounds.  Soft and nontender to palpation. SKIN:  No evidence of breakdown on the heels. NEUROLOGIC:  Her motor strength is 3-/5 bilateral hip flexors and quads, 4/5 bilateral TA and gastroc.  4/5 bilateral upper extremities.  Her sensation is intact to light touch bilaterally.  Her tone is normal. She does have some decreased deep tendon reflex at the ankles and knees. Mood, memory, affect, and orientation are all intact.  She does appear tired today.  She states she slept poorly.  POST ADMISSION PHYSICIAN EVALUATION: 1. Functional  deficits secondary to intra-abdominal and     retroperitoneal hemorrhage with lumbar plexopathy and     deconditioning from multiple medical complications. 2. The patient is admitted to receive collaborative interdisciplinary     care between the physiatrist, rehab nursing staff, and therapy     team. 3. The patient's level of medical complexity and substantial needs in     context so that medical necessity cannot be provided at a lesser     intensive care. 4. The patient has experienced substantial functional loss from her     baseline.  Upon functional assessment at the time of her     preadmission, the  patient was a mod assist bed mobility, min-to-mod     transfers, mod assist ambulation, min upper body, and max assist     lower body dressing and bathing.  Judging by the patient's     diagnosis, physical exam, and functional history, the patient has     potential for functional progress which will result in measurable     gains while on inpatient rehab.  These gains will be of substantial     and practical use upon discharge to home in facilitating mobility,     self-care, and independence.  Interim changes in medical status     since preadmission screening are detailed in history of present     illness. 5. Physiatrist will provide 24-hour management of medical needs as     well as oversight of therapy plan/treatment and provide guidance     appropriate regarding interactions of the two. 6. The 24-hour rehab nursing will assist in management of skin, bowel,     bladder, and help integrate therapy concepts, techniques, and     education. 7. PT will assess and treat for pre gait training, gait training,     endurance, safety.  Goals are for a modified independent level with     all mobility. 8. OT will assess and treat for ADLs, cognitive and perceptual skills,     safety, endurance, and equipment.  Goals are for modified     independent with all ADLs. 9. Speech and language pathology will assess and treat for dysphagia.     Goals are for normal liquid consistency, normal diet without     evidence of aspiration and adequate p.o. fluid and caloric intake. 10.Case management and social worker will assess and treat for     psychosocial issues and discharge planning. 11.Team conference will be held weekly to assess the patient's     progress/goals and to determine barriers to discharge. 12.The patient has demonstrated sufficient medical stability and     exercise capacity to tolerate at least 3 hours therapy day at least     5 days per week.  Estimated length of stay is 7-10 days.   Prognosis for further functional improvement is good.  MEDICAL PROBLEM LIST AND PLAN: 1. History of lupus.  No disease-modifying drugs prescribed at this     point.  We will monitor for signs of joint swelling and other     complications. 2. Hypercoaguable state.  Given recent massive intra-abdominal bleed,     hold off on reinstituting anticoagulants.  She does have an     inferior vena cava filter for her pulmonary embolism prophylaxis. 3. Acute renal failure.  Continue dialysis per Renal service.  Her BUN     and creatinine are steadily improving. 4. Anemia.  Hemoglobin stable at 8.  No transfusion unless than  7.     Monitor CBC. 5. Hypertension.  Home meds include metoprolol.  Continue metoprolol,     but may need to reduce dose. 6. Nausea and vomiting.  Monitor electrolytes.  Monitor for signs of     ileus. 7. Motivation.  Mood appeared be adequate for inpatient     rehabilitation, although she had some nausea and vomiting last     night.  Rehab medicine services explained to the patient.     Erick Colace, M.D.     AEK/MEDQ  D:  02/21/2011  T:  02/22/2011  Job:  [id number]  cc:   Candyce Churn. Allyne Gee, M.D.  Electronically Signed by Claudette Laws M.D. on 03/22/2011 10:22:33 AM

## 2011-02-28 LAB — CBC
HCT: 29.1 % — ABNORMAL LOW (ref 36.0–46.0)
MCH: 29 pg (ref 26.0–34.0)
MCHC: 33 g/dL (ref 30.0–36.0)
MCV: 87.9 fL (ref 78.0–100.0)
Platelets: 374 10*3/uL (ref 150–400)
RDW: 14.8 % (ref 11.5–15.5)

## 2011-02-28 NOTE — Discharge Summary (Addendum)
NAMEANNALIAH, Diana Mack              ACCOUNT NO.:  [id number]  MEDICAL RECORD NO.:  [id number]  LOCATION:  4000                         FACILITY:  [facility]  PHYSICIAN:  Jeoffrey Massed, MD    DATE OF BIRTH:  1961/07/15  DATE OF ADMISSION:  02/22/2011 DATE OF DISCHARGE:  02/22/2011                        DISCHARGE SUMMARY - REFERRING   ADDENDUM TO DISCHARGE SUMMARY:  PRIMARY CARE PRACTITIONER:  Robyn N. Allyne Gee, MD  PRIMARY HEMATOLOGIST:  Laurice Record, MD at the [facility].  For further details, please see the discharge summary that was dictated by Dirk Dress, NP from the Pulmonary Critical Care Service.  FINAL DISCHARGE DIAGNOSES: 1. Ventilator-dependent respiratory failure. 2. Hemorrhagic shock, now stable. 3. Anemia secondary to blood loss, now stable. 4. Acute renal failure likely secondary to acute tubular necrosis     slowly resolving. 5. Uncontrolled hypertension. 6. Dysphagia on a dysphagia III diet. 7. History of hypercoagulable state, currently off Lovenox and     Coumadin. 8. Persistent leukocytosis with no foci of infection, currently off     all antibiotics. 9. Deconditioning and debility secondary to critical illness and     prolonged hospitalization. 10.Nausea with vomiting, now resolved.  DISCHARGE MEDICATIONS: 1. Tylenol 325 mg 2 tablets p.o. q.6 h. p.r.n. 2. Amlodipine 10 mg 1 tablet p.o. daily. 3. Dulcolax 10 grams p.o. daily. 4. Calcium carbonate suspension 500 mg p.o. q.6 h. p.r.n. 5. Aranesp 100 mcg intravenously Wednesdays during hemodialysis. 6. Fentanyl 25 mcg intravenously q.2 h. p.r.n. 7. Hydralazine 10 mg IV q.4 h. p.r.n. if systolic blood pressure is     more than 165. 8. Vistaril 25 mg p.o. q.8 h. p.r.n. 9. Mechanical VTE prophylaxis. 10.Lopressor 25 mg p.o. twice daily. 11.Nepro carb vanilla 237 mL p.o. 3 times a day p.r.n. 12.Zofran 4 mg IV q.8 h. p.r.n. 13.Protonix 40 mg 1 tablet p.o. daily. 14.Sarna anti-itch lotion 1  application topically p.r.n. 15.Sorbitol 30 mL p.o. daily p.r.n. 16.Zolpidem 5 mg p.o. daily p.r.n.  For further details regarding her hospital course, please see the discharge summary that was dictated on June 6 by Dirk Dress, NP. This patient was just transferred to my service today.  Currently she is awake and alert.  There has essentially been no changes to hospital course since the dictated discharge summary on the 6th, with the exception of the following.  Her blood pressure continues to stay elevated and as a result amlodipine had been started yesterday and today it has been increased to 10 mg p.o. daily.  Renal continues to follow this patient for ATN.  She continues to be off any sort of anticoagulation at this point in time.  Ideally per critical care note, they think it is feasible to wait 2 weeks and at this point this has not been resumed and will need to be addressed at some point in time during her hospital course after discussion with her primary oncologist and her GYN doctors.  In regards to her persistent leukocytosis, she is afebrile, does not look toxic.  She has completed a course of antibiotics.  Per critical care note, all of her cultures have been negative and at this point it is suggested that she continued  to be monitored and be recultured if she were to develop a fever.  At this point, it is felt that this patient is stable to be discharged to [facility] for continued care monitoring as well as rehab.  DISPOSITION:  At this point, the patient will be transferred to CIR.  FOLLOWUP INSTRUCTIONS: 1. The patient continues to be on a dysphagia III diet.  With     continued improvement in her functional status, this might need to     be upgraded. 2. She has not yet been started on anticoagulation, this will need to     be addressed with her GYN doctor and with Dr. Dalene Carrow from the     [facility].  She does have an IVC filter in place. 3. She continues to be in  acute renal failure, however, the renal     failure seems to be resolving and will need continued renal     followup at CIR as well. 4. She continues to have persistent leukocytosis, however, overall     trend of the WBC seems to be down trending and will need repeat     cultures only if she has a fever or has a foci of infection for the     antibiotics to be started. 5. The patient will need followup with the primary doctor, Dr.     Allyne Gee, primary GYN, and primary hematologist on discharge.  TOTAL TIME SPENT COORDINATING DISCHARGE:  45 minutes.     Jeoffrey Massed, MD     SG/MEDQ  D:  02/22/2011  T:  02/22/2011  Job:  [id number]  cc:   Laurice Record, M.D. Candyce Churn. Allyne Gee, M.D.  Electronically Signed by Jeoffrey Massed  on 03/31/2011 10:43:20 AM

## 2011-03-01 DIAGNOSIS — R579 Shock, unspecified: Secondary | ICD-10-CM

## 2011-03-01 DIAGNOSIS — R5381 Other malaise: Secondary | ICD-10-CM

## 2011-03-02 DIAGNOSIS — R5381 Other malaise: Secondary | ICD-10-CM

## 2011-03-02 DIAGNOSIS — R579 Shock, unspecified: Secondary | ICD-10-CM

## 2011-03-04 DIAGNOSIS — R5381 Other malaise: Secondary | ICD-10-CM

## 2011-03-04 DIAGNOSIS — R579 Shock, unspecified: Secondary | ICD-10-CM

## 2011-03-04 DIAGNOSIS — G931 Anoxic brain damage, not elsewhere classified: Secondary | ICD-10-CM

## 2011-03-04 LAB — CBC
Hemoglobin: 10.2 g/dL — ABNORMAL LOW (ref 12.0–15.0)
MCH: 28.8 pg (ref 26.0–34.0)
MCHC: 32.8 g/dL (ref 30.0–36.0)
Platelets: 501 10*3/uL — ABNORMAL HIGH (ref 150–400)
RDW: 13.9 % (ref 11.5–15.5)

## 2011-03-18 ENCOUNTER — Ambulatory Visit: Payer: Medicaid Other | Admitting: Physical Therapy

## 2011-03-18 ENCOUNTER — Ambulatory Visit: Payer: Medicaid Other | Attending: Physical Medicine & Rehabilitation | Admitting: *Deleted

## 2011-03-18 DIAGNOSIS — Z5189 Encounter for other specified aftercare: Secondary | ICD-10-CM | POA: Insufficient documentation

## 2011-03-18 DIAGNOSIS — R5381 Other malaise: Secondary | ICD-10-CM | POA: Insufficient documentation

## 2011-03-18 DIAGNOSIS — R41841 Cognitive communication deficit: Secondary | ICD-10-CM | POA: Insufficient documentation

## 2011-03-18 DIAGNOSIS — M6281 Muscle weakness (generalized): Secondary | ICD-10-CM | POA: Insufficient documentation

## 2011-03-18 NOTE — Discharge Summary (Signed)
NAMEMARYCATHERINE, Mack              ACCOUNT NO.:  [id number]  MEDICAL RECORD NO.:  [id number]  LOCATION:  3313                         FACILITY:  [facility]  PHYSICIAN:  Felipa Evener, MD  DATE OF BIRTH:  12-31-1960  DATE OF ADMISSION:  02/09/2011 DATE OF DISCHARGE:                              INTERIM DISCHARGE SUMMARY   The patient is being transitioned from service of Pulmonary Critical Care to the [facility].  This is an interim discharge summary.  DISCHARGE DIAGNOSES: 1. Vent-dependent respiratory failure. 2. Hypovolemic/hemorrhagic shock. 3. Hypertension. 4. Acute renal failure. 5. Dysphagia. 6. Coagulopathy. 7. Leukocytosis. 8. Diabetes.  HISTORY OF PRESENT ILLNESS:  Ms. Diana Mack is a 50 year old female who underwent laparotomy infraumbilically on May 20 by Dr. Nelly Rout and Dr. Antionette Char for bilateral adnexal masses.  There was some difficulty during the case due to dense adhesions and it was felt safe not to pursue.  The patient has a history of bilateral lower extremity DVT and is lupus anticoagulant positive.  Therefore, she was previously on Coumadin.  The patient was discharged to the floor and Coumadin was restarted.  On her planned discharge day, she was dizzy and lightheaded, ultimately coded, and was found to have a hemoglobin of 3.  The patient was transferred emergently to [facility] for further evaluation and workup by General Surgery and then a medical management per Pulmonary Critical Care.  LABORATORY DATA:  At the time the patient coded, June 09, 2024CBC, white blood cells 20.0, hemoglobin 8.3, hematocrit 24.8, and platelets 298, lactate 12.0.  Complete metabolic panel, sodium 129, potassium 4.6, glucose 195, BUN 31, creatinine 2.25, D-dimer 1.52, INR 1.52.  Arterial blood gas on the ventilator 0.50 FIO2, pH 7.45, pCO2 28, pO2 91, and bicarb 19.8.  other pertinent laboratory data, BNP 6843.  DIC panel, platelets 133, no schistocyte.   PT 14.9, INR 1.15, fibrinogen 540, and D- dimer 3.22.  Procalcitonin on May 31, 5.77.  Most recent laboratory data June 6, basic metabolic panel, sodium 141, potassium 3.2, glucose 125, BUN 103, creatinine 8.73, phosphorus 7.3, magnesium 2.9.  C diff PCR is negative.  Micro data, blood culture from June 09, 2024final report is no growth.  Urine culture final report is no growth.  RADIOLOGY DATA:  CT abdomen and pelvis on May 27 shows large mixed density/hyperdense mass like retroperitoneal collection predominantly at the superior aspect of the uterus most compatible with hemorrhage, although the previously seen adnexal masses are not readily distinguishable.  Also a small amount of intraperitoneal ascites, small to moderate soft tissue anasarca and pleural effusion as well as postsurgical changes.  May 26 portable chest x-ray shows ET tube tip at the carina, increased left perihilar airspace opacity, and slight improvement in aeration of the right lung.  Most recent radiology data June 5 shows no significant change and moderate right pleural effusion and bilateral airspace disease and/or atelectasis.  HOSPITAL COURSE BY DISCHARGE DIAGNOSES: 1. Vent-dependent respiratory failure.  This is secondary to high     intra-abdominal pressure, altered mental status, and post arrest.     The patient was extubated on June 3 with minimal O2 demands and is  doing well from respiratory standpoint.  No further need     anticipated for any respiratory interventions.  Continue pulmonary     hygiene and p.r.n. bronchodilators. 2. Hemorrhagic shock, this is in the setting of anticoagulation     postoperatively.  The patient's hemoglobin was 3.  During her     arrest, the patient was given multiple blood products,     anticoagulation was stopped.  She did require a short-term     pressors.  At this time, blood pressure is back to normal.  No     further signs and symptoms of bleeding and shock has  resolved. 3. Hypertension.  The patient's blood pressure is trending back up.     She has been started on half her home dose of Lopressor and this     may need to be increased back to her regular home dose based on her     blood pressures. 4. Acute renal failure, this is in the setting of ATN secondary to     shock.  The patient remains in oliguric renal failure.  Renal     Service is following the patient at this time.  She initially     required CVVH and is now transitioned to HD.  The patient's last     hemodialysis was on February 20, 2011.  According to Dr. Lowell Guitar of the     Renal Service we will try and decrease frequency of HD to see how     the patient does. 5. Dysphagia.  The patient is being followed closely by speech therapy     and is currently tolerating a dysphagia diet. 6. Coagulopathy.  Again, the patient has a history of DVTs,     questionable PEs, and positive lupus anticoagulant prior to     admission with plan for her to be on Coumadin for at least a year.     Her Coumadin was resumed postop contributing to her hemorrhagic     shock.  Coumadin remained on hold at this time.  When Surgery feels     it is appropriate plan should be to resume anticoagulation with     subcu heparin and if the patient tolerates consider resuming     Coumadin.  We would give the patient a total of 2 weeks off of     anticoagulation. 7. Leukocytosis.  White blood cells are trending down.  The patient is     status post a full course of empiric Zosyn.  All cultures have been     negative thus far. 8. Diabetes.  Sliding scale insulin has been discontinued.  Blood     sugars have been within normal range and should continue to be     monitored.  DISCHARGE MEDICATIONS:  To be determined at the time of discharge.  DISCHARGE FOLLOWUP:  The patient will need follow up with her primary care physician as well as GYN.  Other followup to be determined at the time of actual discharge.  DISCHARGE  DIET:  The patient is currently on dysphagia diet with Speech Therapy following.  She may be able to upgrade to a more regular diet pending further evaluation by Speech Therapy.  DISPOSITION:  The patient remains in the step-down unit at [facility] and her care is being transitioned from Pulmonary Critical Care to [facility].  Further discharge summary to be done at the actual time of discharge by [facility].     Dirk Dress, NP  ______________________________ Felipa Evener, MD    KW/MEDQ  D:  02/20/2011  T:  02/21/2011  Job:  [id number]  Electronically Signed by Danford Bad N.P. on 02/28/2011 03:30:06 PM Electronically Signed by Koren Bound MD on 03/18/2011 04:08:59 PM

## 2011-03-22 NOTE — Discharge Summary (Signed)
NAMEJARETZI, Diana Mack              ACCOUNT NO.:  [id number]  MEDICAL RECORD NO.:  [id number]  LOCATION:  4153                         FACILITY:  [facility]  PHYSICIAN:  Erick Colace, M.D.DATE OF BIRTH:  [birth year]  DATE OF ADMISSION:  02/22/2011 DATE OF DISCHARGE:  03/06/2011                              DISCHARGE SUMMARY   DISCHARGE DIAGNOSES: 1. Deconditioning - hemorrhagic shock. 2. 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 3. Acute renal failure. 4. Dysphagia. 5. Anemia. 6,  Bilateral adnexal mass, lupus, retroperitoneal hematoma, malignant hypertension.  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 Christell Constant. Pathology report was negative.  Her Coumadin had been held for procedure and subcutaneous Lovenox added to bridge.  On Feb 08, 2011 with cardiac arrest, CPR initiated x15 minutes, hemoglobin 3.5.  She was transfused 4 units of packed red blood cells, creatinine 3.77 secondary to hemorrhagic shock as she was intubated.  Renal Service followup, hemodialysis initiated.  CT of the abdomen and pelvis showed retroperitoneal hematoma.  General Surgery consulted Dr. Michaell Cowing who advised conservative care.  An IVC filter was placed on Feb 12, 2011 for history of deep vein thrombosis.  Advised no Coumadin therapy at this time.  Placed on a dysphagia III nectar thick liquid diet.  Plan to advance as tolerated.  Slow recovery in her acute renal failure and slowly hope to wean off dialysis.  She was moderate assist ambulation and was admitted for comprehensive rehab program.  PAST MEDICAL HISTORY:  See discharge diagnoses.  No alcohol or  tobacco.  ALLERGIES:  KEFLEX.  SOCIAL HISTORY:  Lives with her older sister, assistance as needed.  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  No children.  FUNCTIONAL HISTORY:  Prior to admission was independent.  FUNCTIONAL STATUS:  Upon admission to [facility], was moderate assist bed mobility, minimum-to-moderate assist transfers, moderate assist to ambulate 25 feet x2 with a rolling walker.  Minimal assist upper body, max assist lower body activities of daily living.  MEDICATIONS PRIOR TO ADMISSION:  Coumadin daily and Lopressor 50 mg twice daily.  PHYSICAL EXAMINATION:  VITAL SIGNS:  Blood pressure 172/84, pulse 93, temperature 98.6, and respirations 20. NEUROLOGIC:  This was an alert female, in no acute distress, and oriented x3.  Follows simple commands.  Deep tendon reflexes hypoactive. Moves all extremities against gravity. LUNGS:  Clear to auscultation. CARDIAC:  Regular rate and rhythm. ABDOMEN:  Soft and nontender.  Good bowel sounds.  REHABILITATION HOSPITAL COURSE:  The patient was admitted to Inpatient Rehab Services with therapies initiated on a 3-hour daily basis consisting of physical therapy, occupational therapy, speech therapy, and rehabilitation nursing.  The following issues were addressed during the patient's rehabilitation stay.  Pertaining to Mrs. Roorda, deconditioning and hemorrhagic shock remained stable.  An IVC filter remained in place on Feb 12, 2011  for history of bilateral deep vein thrombosis.  She had been on chronic Coumadin prior to hospital admission.  This was discontinued due to retroperitoneal hematoma and anemia.  There were no plans to resume this at this time after discussing with Hematology Services.  Her diet was slowly advanced to a regular consistency which she tolerated well.  Her hemoglobin remained stable at 10.2 and hematocrit of 31.  She had undergone exploratory laparotomy for bilateral adnexal  masses.  Pathology report negative. She would follow up with OB/GYN.  Her blood pressures remained monitored.  No orthostatic changes.  She was on Norvasc, Lopressor with no orthostatic changes noted.  Due to her hemorrhagic shock-induced acute renal failure, she was slowly weaned from her hemodialysis. Latest creatinine function of 2.90.  She was no longer on dialysis but would be followed up closely by Renal Services.  The patient received weekly collaborative interdisciplinary team conferences to discuss estimated length of stay, family teaching, and any barriers to discharge.  She was supervision ambulate 50 feet with a rolling walker, supervision transfers, fatigue factors, and continued to improve.  She was supervision upper body, moderate assist lower body, activities of daily living.  She was continent of bowel and bladder.  Full family teaching was completed and ultimate plan is to be discharge to home.  LATEST LABORATORY DATA:  Hemoglobin 10.2, hematocrit 31, and platelet 501,000.  Sodium 134, potassium 3.5, BUN 58, and creatinine 2.90.  DISCHARGE MEDICATIONS:  At the time of dictation included: 1. Norvasc 10 mg daily. 2. Lopressor 50 mg twice daily. 3. Protonix 40 mg daily. 4. Tylenol as needed.  DIET:  Regular.  SPECIAL INSTRUCTIONS:  Follow up Renal Services for ongoing monitoring of acute renal failure which continued to improve and no longer on dialysis therapy.  Follow up OB/GYN, Dr. Antionette Char in relation to her bilateral adnexal masses.  Follow up with Dr. Dalene Carrow in relation to her history of lupus.  The patient is no longer on Coumadin therapy at the discretion of Hematology Services.  Home Health therapies have been arranged as per Rehab Services.     Mariam Dollar, P.A.   ______________________________ Erick Colace, M.D.    DA/MEDQ  D:  03/05/2011  T:  03/05/2011  Job:  [id number]  cc:   Candyce Churn. Allyne Gee, M.D. Maree Krabbe,  M.D. Lowella Dell, M.D. Roseanna Rainbow, M.D. Laurice Record, M.D.  Electronically Signed by Mariam Dollar P.A. on 03/07/2011 07:59:46 AM Electronically Signed by Claudette Laws M.D. on 03/22/2011 10:22:28 AM

## 2011-03-25 ENCOUNTER — Ambulatory Visit: Payer: Medicaid Other | Admitting: *Deleted

## 2011-03-25 ENCOUNTER — Ambulatory Visit: Payer: Medicaid Other | Admitting: Rehabilitative and Restorative Service Providers"

## 2011-04-03 ENCOUNTER — Ambulatory Visit: Payer: Medicaid Other | Attending: Gynecologic Oncology | Admitting: Gynecologic Oncology

## 2011-04-03 DIAGNOSIS — N135 Crossing vessel and stricture of ureter without hydronephrosis: Secondary | ICD-10-CM | POA: Insufficient documentation

## 2011-04-03 DIAGNOSIS — N838 Other noninflammatory disorders of ovary, fallopian tube and broad ligament: Secondary | ICD-10-CM | POA: Insufficient documentation

## 2011-04-03 DIAGNOSIS — N839 Noninflammatory disorder of ovary, fallopian tube and broad ligament, unspecified: Secondary | ICD-10-CM | POA: Insufficient documentation

## 2011-04-03 DIAGNOSIS — D259 Leiomyoma of uterus, unspecified: Secondary | ICD-10-CM | POA: Insufficient documentation

## 2011-04-05 NOTE — Consult Note (Signed)
NAMESULY, VUKELICH              ACCOUNT NO.:  [id number]  MEDICAL RECORD NO.:  [id number]  LOCATION:  GYN                          FACILITY:  [facility]  PHYSICIAN:  Romualdo Prosise A. Duard Brady, MD    DATE OF BIRTH:  1961/08/09  DATE OF CONSULTATION:  04/03/2011 DATE OF DISCHARGE:                                CONSULTATION   HISTORY OF PRESENT ILLNESS:  Ms. Fridays is a very pleasant 50 year old with very complicated medical history.  She was initially referred to Dr. Nelly Rout for evaluation of bilateral adnexal masses and the decision was made to proceed with surgery.  On Feb 05, 2011, she underwent exploratory laparotomy with biopsies.  Operative findings included extensive pelvic fibrosis.  The rectosigmoid was adherent to the posterior uterus and pelvic sidewalls.  The bladder was adherent and fixed overlapping the fundus of the uterus.  The appendix was encased within a fibrous adhesion and was not visualized.  The cecum was partially involved with dense pelvic adhesions.  Biopsies revealed benign fibroadipose tissue and granulation tissue.  There was no evidence of endometriosis, atypia or malignancy.  This was the same on biopsies of both the peritoneum and the omentum.  The patient had a very complicated postoperative course.  She was admitted for her surgery on Feb 05, 2011, as stated above.  Her Coumadin had been held for the procedure and subcu Lovenox was added to bridge.  On Feb 08, 2011, she was noted to have a cardiac arrest.  CPR was initiated x15 minutes.  Her hemoglobin was 3.5.  She was transfused 4 units of packed red cells. Her creatinine was 3.7 secondary to hemorrhagic shock and she was intubated.  CT scan showed retroperitoneal hematoma.  An IVC filter was placed for history of DVT.  The patient had slow recovery from renal failure and the decision was made to wean her off dialysis.  She slowly progressed over time and her renal function continued to improve.  She was  subsequently discharged from the hospital on March 06, 2011.  She was doing physical therapy while at home and continues to do that.  She states that her weight is steady, her legs felt weak but she is doing very well.  She denies any pain.  She denies any change in bowel or bladder habits.  She states she is very thankful for the care that she received and is very thankful to God that she survived such as scary ordeal.  She is clearly very traumatized.  She is currently living with her sister and her sister accompanies her today.  She otherwise, again as stated above, has no complaints.  MEDICATIONS:  Include amlodipine 10 mg daily, metoprolol 15 mg twice daily.  PHYSICAL EXAMINATION:  VITAL SIGNS:  Weight 181 pounds, blood pressure 120/80, respirations 16, temperature 97.7. GENERAL:  Well-nourished, well-developed female, in no acute distress. ABDOMEN:  She has a well-healed transverse skin incision.  Abdomen is soft and nontender.  There is no palpable mass or hepatosplenomegaly. Exam is somewhat limited by habitus. PELVIC:  External genitalia is within normal limits.  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  There is no nodularity.  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  I had a lengthy discussion with the patient and her sister today regarding the fact that the ovarian masses were not detected and could not be visualized and that question still is unanswered.  The patient was offered followup including followup imaging as well as hematologic followup and she declined.  She states she would not do anything and does not ever want to have surgery.  She is happy that the biopsy that were done did not reveal cancer and she does not want to have any followup at this point.  Again I reviewed with her that the ovaries were not seen and the  solid components were still unanswered but she is very adamant that she is lucky to be alive and does not want to have any procedures done and therefore since she is feeling well does not want to have any followup whatsoever for any other issue at this point.  I cannot necessarily disagree with her in this context though I would think that some followup of these masses would be reasonable but if she is not going to have any procedures done as a result of them, then watchful waiting and conservative followup is not an unreasonable option.  At this point, she does not want to follow up with [place].  She states that she is up-to-date on her Pap smears, though I do not have documentation of one.  We will release her from our clinic.  She knows that we will be happy to see her in the future should the need arise.     Yuriana Gaal A. Duard Brady, MD     PAG/MEDQ  D:  04/03/2011  T:  04/03/2011  Job:  [id number]  cc:   Osborn Coho, M.D. Fax: [phone]  Telford Nab, R.N. [address] [place], [place] [address]  Laurice Record, M.D. Fax: [phone]  Robyn N. Allyne Gee, M.D. Fax: [phone]  Electronically Signed by Cleda Mccreedy MD on 04/05/2011 10:16:24 AM

## 2011-04-17 ENCOUNTER — Other Ambulatory Visit: Payer: Self-pay | Admitting: Hematology and Oncology

## 2011-04-17 ENCOUNTER — Encounter (HOSPITAL_BASED_OUTPATIENT_CLINIC_OR_DEPARTMENT_OTHER): Payer: Self-pay | Admitting: Hematology and Oncology

## 2011-04-17 DIAGNOSIS — I749 Embolism and thrombosis of unspecified artery: Secondary | ICD-10-CM

## 2011-04-17 DIAGNOSIS — R894 Abnormal immunological findings in specimens from other organs, systems and tissues: Secondary | ICD-10-CM

## 2011-04-17 LAB — CBC WITH DIFFERENTIAL/PLATELET
BASO%: 0.5 % (ref 0.0–2.0)
LYMPH%: 33.4 % (ref 14.0–49.7)
MCHC: 34.5 g/dL (ref 31.5–36.0)
MONO#: 0.5 10*3/uL (ref 0.1–0.9)
Platelets: 322 10*3/uL (ref 145–400)
RBC: 4.1 10*6/uL (ref 3.70–5.45)
RDW: 14.9 % — ABNORMAL HIGH (ref 11.2–14.5)
WBC: 6.3 10*3/uL (ref 3.9–10.3)

## 2011-04-18 LAB — D-DIMER, QUANTITATIVE: D-Dimer, Quant: 1.12 ug/mL-FEU — ABNORMAL HIGH (ref 0.00–0.48)

## 2011-04-18 LAB — COMPREHENSIVE METABOLIC PANEL
BUN: 17 mg/dL (ref 6–23)
CO2: 23 mEq/L (ref 19–32)
Creatinine, Ser: 1.12 mg/dL — ABNORMAL HIGH (ref 0.50–1.10)
Glucose, Bld: 109 mg/dL — ABNORMAL HIGH (ref 70–99)
Total Bilirubin: 0.3 mg/dL (ref 0.3–1.2)
Total Protein: 8 g/dL (ref 6.0–8.3)

## 2011-04-18 LAB — LUPUS ANTICOAGULANT PANEL
DRVVT: 36.7 secs (ref 36.2–44.3)
PTT Lupus Anticoagulant: 37 secs (ref 30.0–45.6)

## 2011-04-18 LAB — FACTOR 8 ASSAY: Coagulation Factor VIII: 131 % (ref 73–140)

## 2011-04-19 ENCOUNTER — Ambulatory Visit (HOSPITAL_COMMUNITY)
Admission: RE | Admit: 2011-04-19 | Discharge: 2011-04-19 | Disposition: A | Discharge status: Home / Self Care | Payer: Medicaid Other | Source: Ambulatory Visit | Attending: Hematology and Oncology | Admitting: Hematology and Oncology

## 2011-04-19 DIAGNOSIS — Z86718 Personal history of other venous thrombosis and embolism: Secondary | ICD-10-CM | POA: Insufficient documentation

## 2011-04-22 ENCOUNTER — Ambulatory Visit: Payer: Medicaid Other | Admitting: Rehabilitative and Restorative Service Providers"

## 2011-04-22 ENCOUNTER — Ambulatory Visit: Payer: Medicaid Other | Attending: Physical Medicine & Rehabilitation

## 2011-04-22 DIAGNOSIS — M6281 Muscle weakness (generalized): Secondary | ICD-10-CM | POA: Insufficient documentation

## 2011-04-22 DIAGNOSIS — R5381 Other malaise: Secondary | ICD-10-CM | POA: Insufficient documentation

## 2011-04-22 DIAGNOSIS — R41841 Cognitive communication deficit: Secondary | ICD-10-CM | POA: Insufficient documentation

## 2011-04-22 DIAGNOSIS — Z5189 Encounter for other specified aftercare: Secondary | ICD-10-CM | POA: Insufficient documentation

## 2011-04-24 ENCOUNTER — Encounter: Payer: Self-pay | Admitting: *Deleted

## 2011-04-24 ENCOUNTER — Ambulatory Visit: Payer: Self-pay | Admitting: Rehabilitative and Restorative Service Providers"

## 2011-04-24 ENCOUNTER — Encounter (HOSPITAL_BASED_OUTPATIENT_CLINIC_OR_DEPARTMENT_OTHER): Payer: Medicaid Other | Admitting: Hematology and Oncology

## 2011-04-24 DIAGNOSIS — R894 Abnormal immunological findings in specimens from other organs, systems and tissues: Secondary | ICD-10-CM

## 2011-04-24 DIAGNOSIS — I749 Embolism and thrombosis of unspecified artery: Secondary | ICD-10-CM

## 2011-06-13 LAB — URINALYSIS, ROUTINE W REFLEX MICROSCOPIC
Bilirubin Urine: NEGATIVE
Nitrite: NEGATIVE
Specific Gravity, Urine: 1.02
pH: 7

## 2011-06-17 LAB — URINALYSIS, ROUTINE W REFLEX MICROSCOPIC
Glucose, UA: NEGATIVE
Protein, ur: NEGATIVE
Urobilinogen, UA: 0.2

## 2011-06-17 LAB — GC/CHLAMYDIA PROBE AMP, GENITAL
Chlamydia, DNA Probe: NEGATIVE
GC Probe Amp, Genital: NEGATIVE

## 2011-06-17 LAB — WET PREP, GENITAL
Trich, Wet Prep: NONE SEEN
WBC, Wet Prep HPF POC: NONE SEEN
Yeast Wet Prep HPF POC: NONE SEEN

## 2011-06-17 LAB — RPR: RPR Ser Ql: NONREACTIVE

## 2011-07-01 LAB — URINALYSIS, ROUTINE W REFLEX MICROSCOPIC
Glucose, UA: NEGATIVE
Hgb urine dipstick: NEGATIVE
Ketones, ur: NEGATIVE
Protein, ur: NEGATIVE
Urobilinogen, UA: 1

## 2011-07-01 LAB — GC/CHLAMYDIA PROBE AMP, GENITAL: GC Probe Amp, Genital: NEGATIVE

## 2011-07-01 LAB — RPR: RPR Ser Ql: NONREACTIVE

## 2011-07-01 LAB — WET PREP, GENITAL: Yeast Wet Prep HPF POC: NONE SEEN

## 2011-10-21 ENCOUNTER — Telehealth: Payer: Self-pay | Admitting: Hematology and Oncology

## 2011-10-21 NOTE — Telephone Encounter (Signed)
Diana Mack will have her sister call 2/5 to get appt info  aom

## 2012-01-01 ENCOUNTER — Other Ambulatory Visit (HOSPITAL_BASED_OUTPATIENT_CLINIC_OR_DEPARTMENT_OTHER): Payer: Medicaid Other | Admitting: Lab

## 2012-01-01 ENCOUNTER — Encounter: Payer: Self-pay | Admitting: Hematology and Oncology

## 2012-01-01 ENCOUNTER — Ambulatory Visit (HOSPITAL_BASED_OUTPATIENT_CLINIC_OR_DEPARTMENT_OTHER): Payer: Medicaid Other | Admitting: Hematology and Oncology

## 2012-01-01 VITALS — BP 110/74 | HR 68 | Temp 99.0°F | Ht 62.0 in | Wt 216.4 lb

## 2012-01-01 DIAGNOSIS — I82409 Acute embolism and thrombosis of unspecified deep veins of unspecified lower extremity: Secondary | ICD-10-CM

## 2012-01-01 DIAGNOSIS — I2699 Other pulmonary embolism without acute cor pulmonale: Secondary | ICD-10-CM

## 2012-01-01 DIAGNOSIS — I749 Embolism and thrombosis of unspecified artery: Secondary | ICD-10-CM

## 2012-01-01 DIAGNOSIS — I10 Essential (primary) hypertension: Secondary | ICD-10-CM | POA: Insufficient documentation

## 2012-01-01 LAB — CBC WITH DIFFERENTIAL/PLATELET
BASO%: 0.4 % (ref 0.0–2.0)
Eosinophils Absolute: 0.2 10*3/uL (ref 0.0–0.5)
MCHC: 34.7 g/dL (ref 31.5–36.0)
MONO#: 0.3 10*3/uL (ref 0.1–0.9)
NEUT#: 2.9 10*3/uL (ref 1.5–6.5)
Platelets: 308 10*3/uL (ref 145–400)
RBC: 4.89 10*6/uL (ref 3.70–5.45)
WBC: 5.4 10*3/uL (ref 3.9–10.3)
lymph#: 2 10*3/uL (ref 0.9–3.3)
nRBC: 0 % (ref 0–0)

## 2012-01-01 NOTE — Patient Instructions (Signed)
Patient to follow up as instructed.   No current outpatient prescriptions on file.        April 2013  Sunday Monday Tuesday Wednesday Thursday Fizer Saturday      1   2   3   4   5   6    7   8   9   10  Happy Birthday!   11   12   13    14   15   16   17    LAB MO     9:00 AM  (15 min.)  Windell Hummingbird  [facility]   EST PT 30   9:30 AM  (30 min.)  Laurice Record, MD  [facility]COL[facility] 18   19   20    21   22   23   24   25   26   27    28   29    30

## 2012-01-01 NOTE — Progress Notes (Signed)
CC:   Diana Skeans, MD  IDENTIFYING STATEMENT:  Patient is a 51 year old woman with history of bilateral extremity DVT and history of lupus anticoagulant who presents for followup.  INTERVAL HISTORY:  Diana Mack is doing well, was last seen 9 months ago.  Has no complaints.  Denies chest pain, abdominal pain, or bleeding.  She last received anticoagulation in May 2012.  It was discontinued following a retroperitoneal bleed after surgery.  MEDICATIONS:  Reviewed and updated.  PHYSICAL EXAMINATION:  Alert and oriented x3.  Vitals:  Pulse 68, blood pressure 110/74, temperature 99, respirations 20, weight is 206 pounds. HEENT:  Head is atraumatic, normocephalic.  Sclerae anicteric.  Mouth moist.  Chest and CVS: Unremarkable.  Abdomen:  Soft, nontender.  Bowel sounds present.  Extremities:  No calf tenderness.  LAB DATA:  01/01/2012:  White cell count 5.4, hemoglobin 14.7, hematocrit 42.4, platelets 208.  IMPRESSION AND PLAN:  Diana Mack is a 51 year old woman with history of bilateral lower extremity DVT who was on Coumadin for approximately 5 months and was subsequently discontinued following a postoperative cardiac arrest, hemorrhagic shock and retroperitoneal hematoma in May 2012.  She has since been off anticoagulation and she is status post IVC filter placement.  Further anticoagulation was not recommended at that time.  Diana Mack, at this visit, has no complaints and appears to be doing well.  She has close follow up with her primary care physician. So, with this said, she will be discharged from the Hematology Clinic and for any future issues or concerns from a hematology standpoint, she is not to hesitate to contact us.    ______________________________ Laurice Record, M.D. LIO/MEDQ  D:  01/01/2012  T:  01/01/2012  Job:  [id number]

## 2012-01-01 NOTE — Progress Notes (Signed)
This office note has been dictated.

## 2012-09-05 IMAGING — DX DG ABD PORTABLE 1V
1 series · 1 of 1 positions shown · non-contrast
Comparison: Plain film 02/08/2011 at [DATE] hours

CLINICAL DATA: Auditory distress, tube placement

ABDOMEN - 1 VIEW

[ap (kub)]
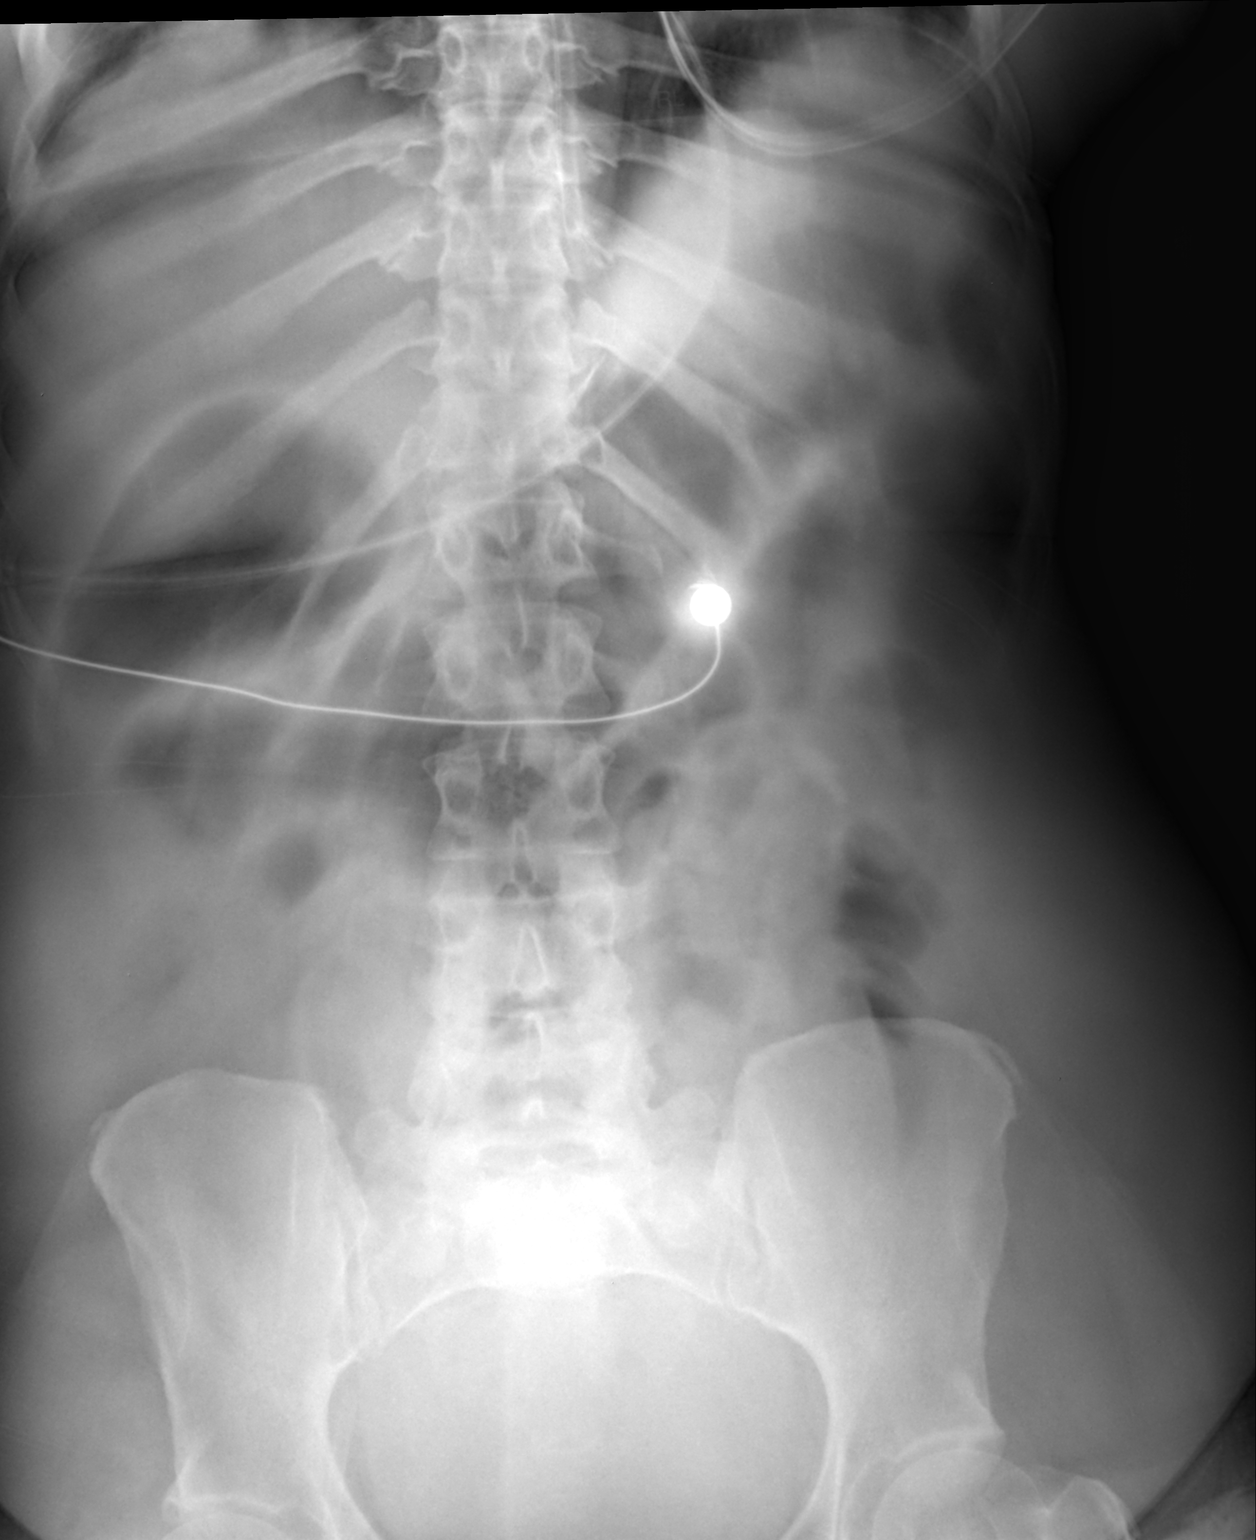

[1 of 1 positions shown; findings below may reference images not displayed]

FINDINGS: Interval placement of NG tube.  The tip of the tube is
difficult to define due to patient motion but appears within the
body of the stomach. Gas filled loops of colon similar to prior.
IMPRESSION: NG tube placed in the stomach.

## 2012-09-06 IMAGING — CR DG CHEST 1V PORT
1 series · 1 of 1 positions shown · non-contrast
Comparison: 02/08/2011

CLINICAL DATA: Respiratory distress.

PORTABLE CHEST - 1 VIEW

[view not recorded]
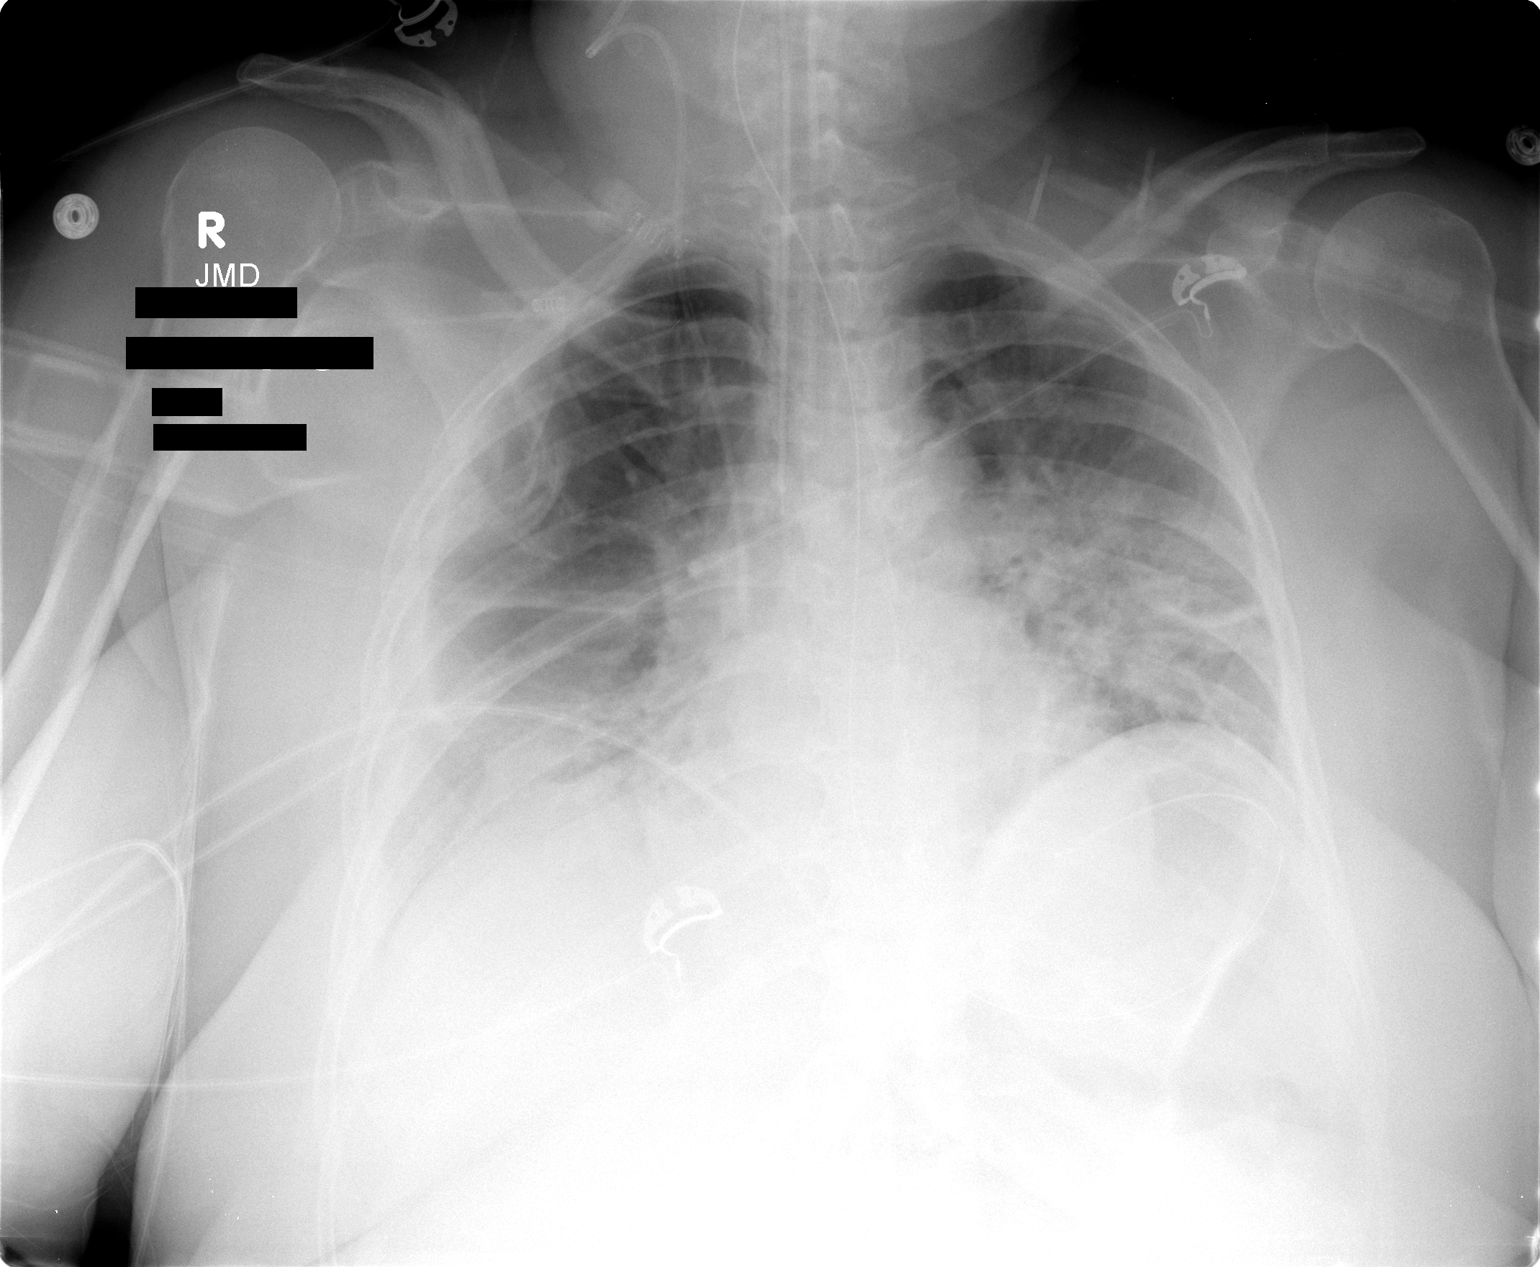

[1 of 1 positions shown; findings below may reference images not displayed]

FINDINGS: Endotracheal tube tip is at the carina and may be
selecting the right mainstem bronchus.  Retraction of 3.5 cm is
recommended.

Right IJ line tip projects over the superior vena cava.
Nasogastric tube tip is in the stomach.

Increased left perihilar airspace opacity noted.

Slightly improved aeration in the right lung noted.  The right
pleural effusion is mildly less prominent.

Low lung volumes noted.  Heart size remains within normal limits.
IMPRESSION: 1.  ET tube tip is at the carina and may be select in the right
mainstem bronchus (malpositioned).  Retraction of 3.5 cm is
recommended.
2.  Increased left perihilar airspace opacity, possibly from
pneumonia or atelectasis.
3.  Slight improvement in aeration in the right lung.

Critical test results regarding the endotracheal tube were
telephoned to Gelizmar Flach, RN at the time of interpretation on
02/09/2011 at [DATE] a.m.

## 2012-09-07 IMAGING — CT CT ABD-PELV W/O CM
2 of 4 series · 16 of 46 positions shown, 18 images · non-contrast
Comparison: Preoperative CT 10/30/2010, pelvic ultrasound
01/27/2009

CLINICAL DATA: Pelvic mass on prior imaging, exploratory
laparotomy at an outside institution with reported intraoperative
hemorrhage.  The patient was transferred to this institution for
further care.  Now with hypotension, ventilated.

CT ABDOMEN AND PELVIS WITHOUT CONTRAST
TECHNIQUE: Multidetector CT imaging of the abdomen and pelvis was
performed following the standard protocol without intravenous
contrast.

[Series 2: abd/pelv w/o 5.0 b31f st · axial · non-contrast · 0.90mm/px · z∈[-482,-32]mm · 13 of 100 slices shown, 15 images]
[im 5/100  soft-tissue]
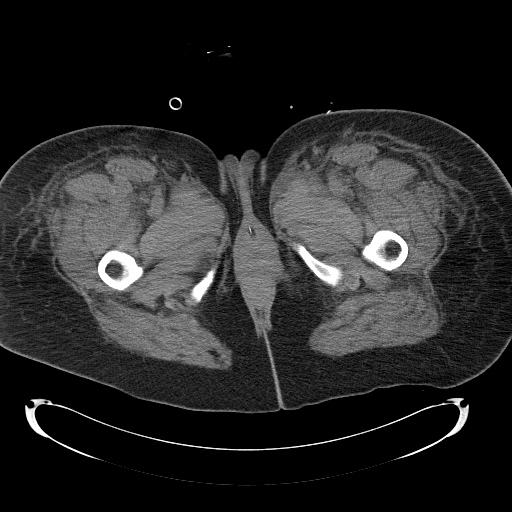
[im 5/100  bone]
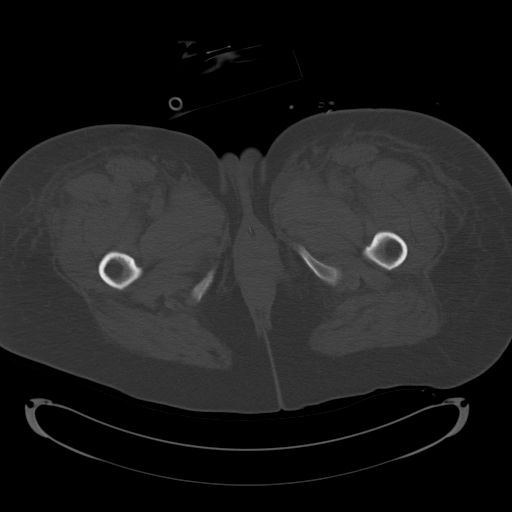
[im 13/100  soft-tissue]
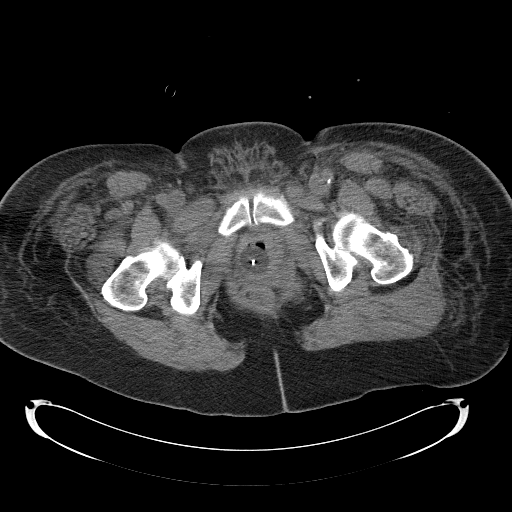
[im 22/100  soft-tissue]
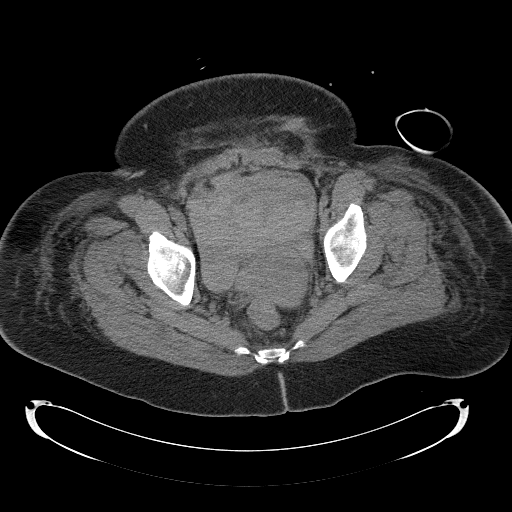
[im 26/100  soft-tissue]
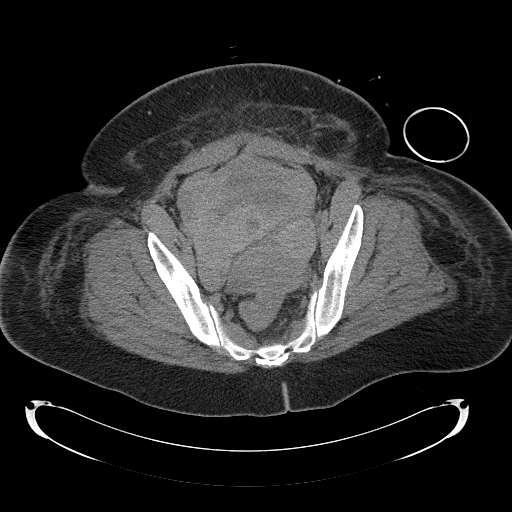
[im 35/100  soft-tissue]
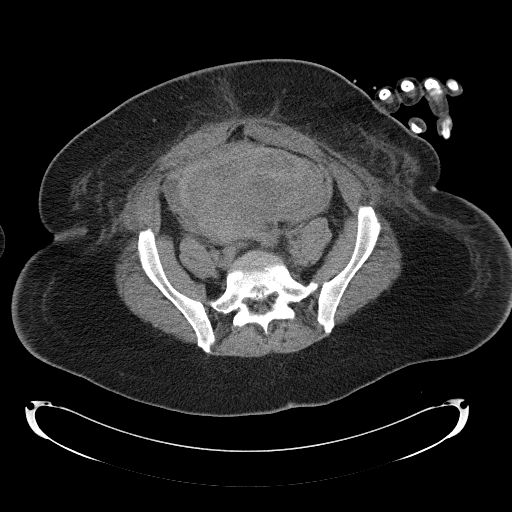
[im 44/100  soft-tissue]
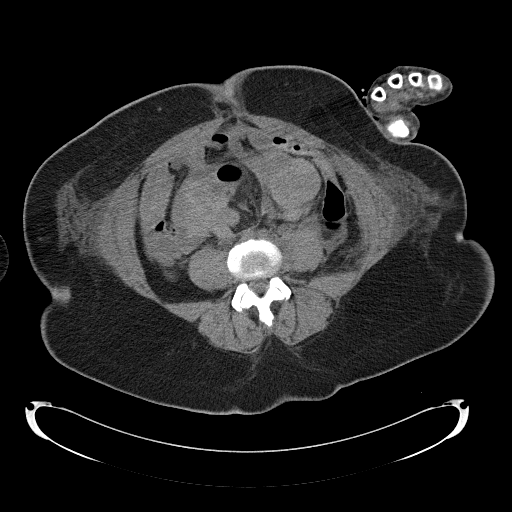
[im 52/100  soft-tissue]
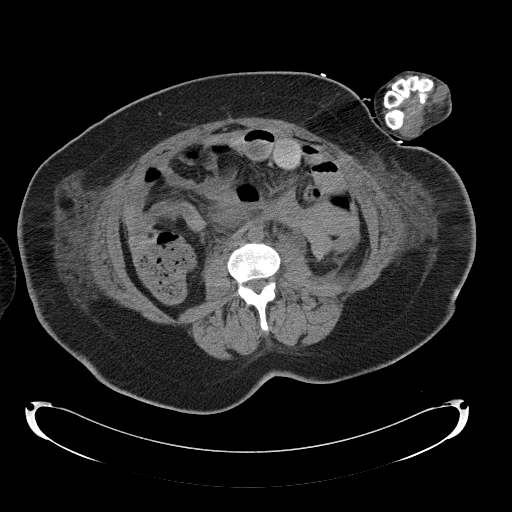
[im 56/100  soft-tissue]
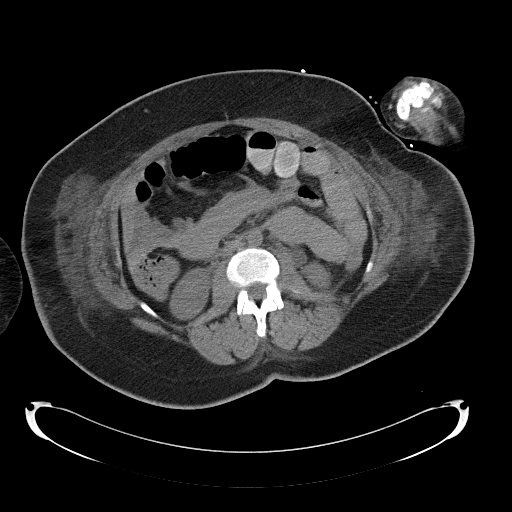
[im 65/100  soft-tissue]
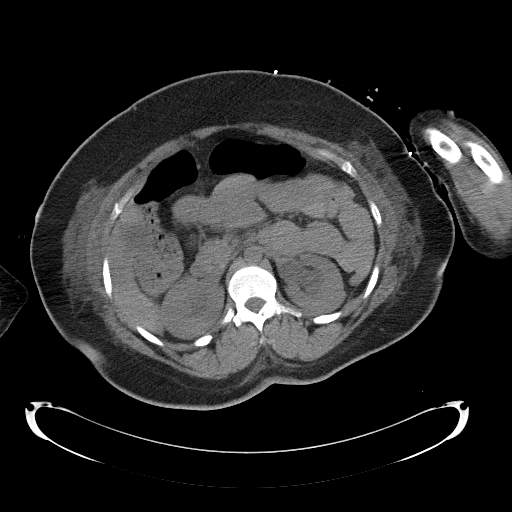
[im 65/100  bone]
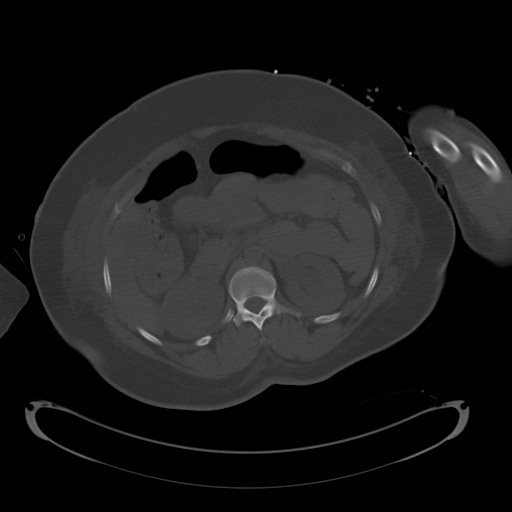
[im 74/100  soft-tissue]
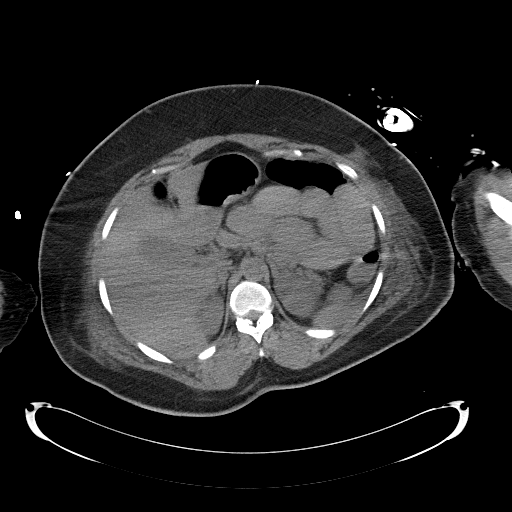
[im 78/100  soft-tissue]
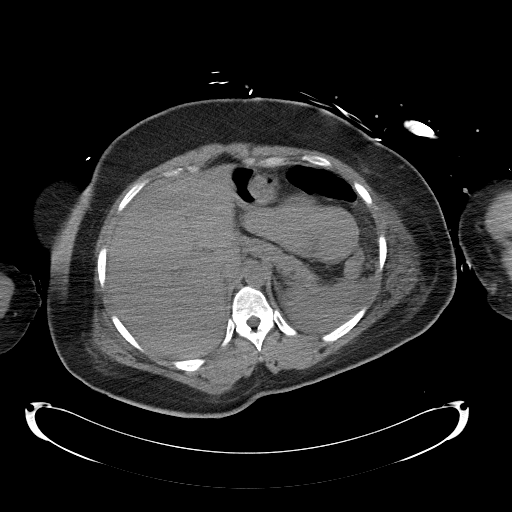
[im 87/100  soft-tissue]
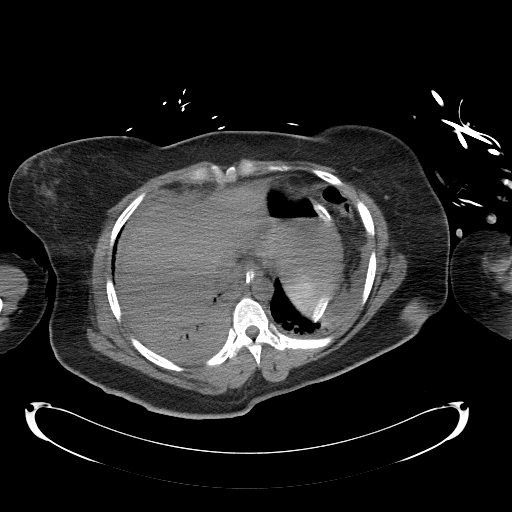
[im 95/100  soft-tissue]
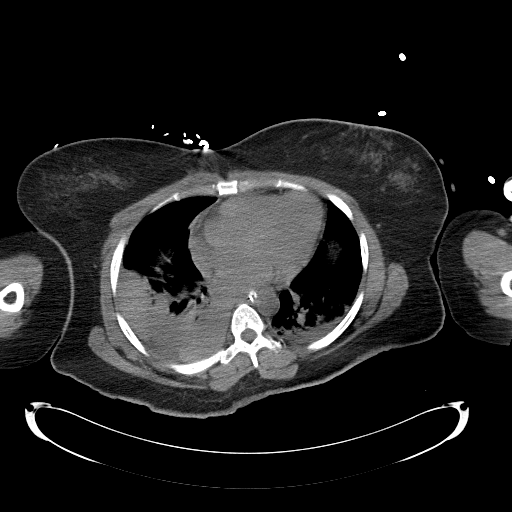

[Series 5: abd/pelv w/o 2.0 spo cor thins · coronal · non-contrast · 1.04mm/px · 3 of 137 slices shown]
[im 46/137  soft-tissue]
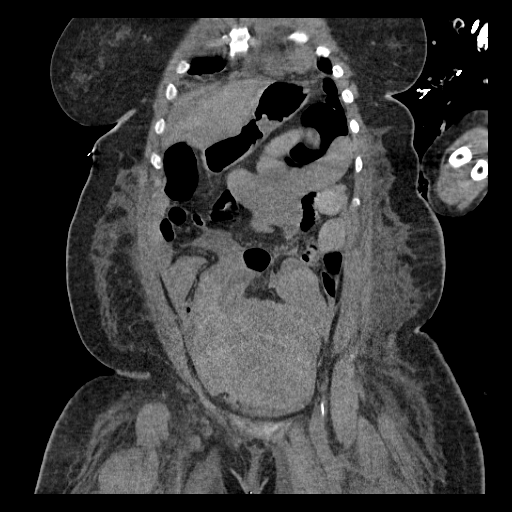
[im 61/137  soft-tissue]
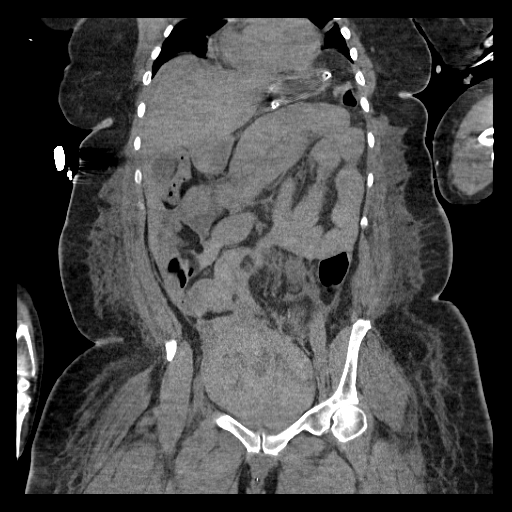
[im 76/137  soft-tissue]
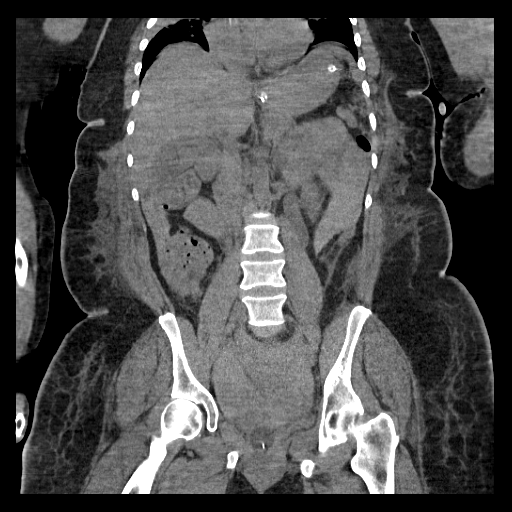

[16 of 46 positions shown; findings below may reference images not displayed]

FINDINGS: Right greater than left bilateral pleural effusions are
noted with associated compressive atelectasis. Nasogastric tube
terminates in the stomach.

Unenhanced CT was performed per clinician order.  Lack of IV
contrast limits sensitivity and specificity, especially for
evaluation of abdominal/pelvic solid viscera.  Streak artifact from
the patient's arms and support apparatus is noted.  Mild to
moderate soft tissue anasarca is noted.  Stranding is noted focally
within the abdominal wall at the locations of presumed recent
incision.  A few foci of free air are noted in the pelvis in the
region of recent surgery.

The unenhanced liver, gallbladder, adrenal glands, and pancreas are
unremarkable.  The spleen is not well visualized.  There is
perisplenic and perihepatic ascites.  Moderate bilateral
hydronephrosis and proximal hydroureter is noted.  The uterus
cannot be followed to the level of the ureterovesicular junction
due to obscuration by adjacent soft tissue structures.  No
lymphadenopathy.

No dilated loop of large or small bowel is seen.  Within the
pelvis, there is a large mixed attenuation oval mass-like structure
predominately superior to the uterus, measuring 14.2 x 13.1 x
cm.  This is not readily distinguishable from the previously
reported pelvic masses.  The uterus is visible posterior to this
and is inhomogeneously hypo attenuating but grossly unchanged from
prior exam.  The adjacent bowel is normal. Bladder is decompressed
with a Foley catheter in place.  A small amount of ascites tracks
along the pericolic gutters.  No acute osseous finding.
IMPRESSION: Large mixed density/hyperdense mass like retroperitoneal collection
predominately at the superior aspect of the uterus, most compatible
with hemorrhage, although the previously seen adnexal masses are
not readily distinguishable from this area.

There is also a small amount of intraperitoneal ascites, small to
moderate soft tissue anasarca, and pleural effusion is likely
indicating third spacing.

Post surgical changes as above.

Findings discussed with Dr. Prerna by Dr. Malutki and also
previously with Dr. Mancuso.

## 2013-07-29 ENCOUNTER — Other Ambulatory Visit (HOSPITAL_COMMUNITY): Payer: Self-pay | Admitting: Internal Medicine

## 2013-07-29 DIAGNOSIS — Z1231 Encounter for screening mammogram for malignant neoplasm of breast: Secondary | ICD-10-CM

## 2013-08-13 ENCOUNTER — Ambulatory Visit (HOSPITAL_COMMUNITY): Payer: Medicaid Other | Attending: Internal Medicine

## 2013-08-19 ENCOUNTER — Other Ambulatory Visit (HOSPITAL_COMMUNITY): Payer: Self-pay | Admitting: Internal Medicine

## 2013-08-19 DIAGNOSIS — Z1231 Encounter for screening mammogram for malignant neoplasm of breast: Secondary | ICD-10-CM

## 2013-09-03 ENCOUNTER — Ambulatory Visit (HOSPITAL_COMMUNITY)
Admission: RE | Admit: 2013-09-03 | Discharge: 2013-09-03 | Disposition: A | Discharge status: Home / Self Care | Payer: Medicare Other | Source: Ambulatory Visit | Attending: Internal Medicine | Admitting: Internal Medicine

## 2013-09-03 DIAGNOSIS — Z1231 Encounter for screening mammogram for malignant neoplasm of breast: Secondary | ICD-10-CM | POA: Insufficient documentation

## 2014-06-28 ENCOUNTER — Telehealth: Payer: Self-pay | Admitting: Critical Care Medicine

## 2014-06-28 NOTE — Telephone Encounter (Signed)
Called pt. Line rang several times and no answer WCB Does not look like pt has been seen in our office.

## 2014-06-29 NOTE — Telephone Encounter (Signed)
Spoke with pt and and she states that PW placed an IVC filter for her in 2012 and she wants to know how long this will last and if it needs to be replaced.  Epic records show that filter was placed on 1/70/01 following complications from uterine surgery.  Please advise.

## 2014-07-08 NOTE — Telephone Encounter (Signed)
i will call her Monday but tell her today, the filter is permanent, it cannot now be removed

## 2014-07-08 NOTE — Telephone Encounter (Signed)
Patient is calling again about the same. [phone]

## 2014-07-08 NOTE — Telephone Encounter (Signed)
I spoke with the pt and advised that Dr. Joya Gaskins is not in the office until Monday and that I will send him the message. She states she wants Dr. Joya Gaskins to call her directly on the # listed to discuss on Monday. Annabella Bing, CMA

## 2014-07-08 NOTE — Telephone Encounter (Signed)
Spoke with the pt and notified of recs per PW  She verbalized understanding  Will forward back to PW as reminder to call her Monday

## 2014-07-08 NOTE — Telephone Encounter (Signed)
Patient states she only wants to talk to PW.

## 2014-07-11 NOTE — Telephone Encounter (Signed)
i answered the pts questions on the ivc filter which is permanent

## 2014-07-12 NOTE — Telephone Encounter (Signed)
Noted by triage Will sign off 

## 2016-02-25 ENCOUNTER — Encounter (HOSPITAL_COMMUNITY): Payer: Self-pay

## 2016-02-25 ENCOUNTER — Emergency Department (HOSPITAL_COMMUNITY)
Admission: EM | Admit: 2016-02-25 | Discharge: 2016-02-25 | Disposition: A | Discharge status: Home / Self Care | Payer: Medicare HMO | Attending: Emergency Medicine | Admitting: Emergency Medicine

## 2016-02-25 DIAGNOSIS — L500 Allergic urticaria: Secondary | ICD-10-CM | POA: Diagnosis not present

## 2016-02-25 DIAGNOSIS — I10 Essential (primary) hypertension: Secondary | ICD-10-CM | POA: Insufficient documentation

## 2016-02-25 DIAGNOSIS — Z79899 Other long term (current) drug therapy: Secondary | ICD-10-CM | POA: Diagnosis not present

## 2016-02-25 DIAGNOSIS — L509 Urticaria, unspecified: Secondary | ICD-10-CM

## 2016-02-25 DIAGNOSIS — T7840XA Allergy, unspecified, initial encounter: Secondary | ICD-10-CM | POA: Diagnosis present

## 2016-02-25 HISTORY — DX: Essential (primary) hypertension: I10

## 2016-02-25 LAB — I-STAT CHEM 8, ED
BUN: 17 mg/dL (ref 6–20)
CALCIUM ION: 1.16 mmol/L (ref 1.12–1.23)
CHLORIDE: 101 mmol/L (ref 101–111)
Creatinine, Ser: 1.5 mg/dL — ABNORMAL HIGH (ref 0.44–1.00)
GLUCOSE: 113 mg/dL — AB (ref 65–99)
HCT: 49 % — ABNORMAL HIGH (ref 36.0–46.0)
Hemoglobin: 16.7 g/dL — ABNORMAL HIGH (ref 12.0–15.0)
Potassium: 4 mmol/L (ref 3.5–5.1)
Sodium: 138 mmol/L (ref 135–145)
TCO2: 25 mmol/L (ref 0–100)

## 2016-02-25 MED ORDER — FAMOTIDINE 20 MG PO TABS
20.0000 mg | ORAL_TABLET | Freq: Two times a day (BID) | ORAL | Status: DC
Start: 2016-02-25 — End: 2016-10-21

## 2016-02-25 MED ORDER — DIPHENHYDRAMINE HCL 50 MG/ML IJ SOLN
25.0000 mg | Freq: Once | INTRAMUSCULAR | Status: AC
  Administered 2016-02-25: 25 mg via INTRAVENOUS
  Filled 2016-02-25: qty 1

## 2016-02-25 MED ORDER — METHYLPREDNISOLONE SODIUM SUCC 125 MG IJ SOLR
125.0000 mg | Freq: Once | INTRAMUSCULAR | Status: AC
  Administered 2016-02-25: 125 mg via INTRAVENOUS
  Filled 2016-02-25: qty 2

## 2016-02-25 MED ORDER — DIPHENHYDRAMINE HCL 25 MG PO TABS: 25.0000 mg | ORAL_TABLET | Freq: Four times a day (QID) | ORAL | Status: DC

## 2016-02-25 MED ORDER — PREDNISONE 50 MG PO TABS: 50.0000 mg | ORAL_TABLET | Freq: Every day | ORAL | Status: DC

## 2016-02-25 MED ORDER — FAMOTIDINE IN NACL 20-0.9 MG/50ML-% IV SOLN
20.0000 mg | Freq: Once | INTRAVENOUS | Status: AC
  Administered 2016-02-25: 20 mg via INTRAVENOUS
  Filled 2016-02-25: qty 50

## 2016-02-25 NOTE — ED Provider Notes (Signed)
CSN: [id number]     Arrival date & time 02/25/16  1250 History   First MD Initiated Contact with Patient 02/25/16 1300     Chief Complaint  Patient presents with  . Allergic Reaction   Patient is a 55 y.o. female presenting with allergic reaction. The history is provided by the patient.  Allergic Reaction Presenting symptoms: itching and rash   Presenting symptoms: no difficulty breathing, no difficulty swallowing and no wheezing   Rash:    Location:  Full body   Quality: burning and itchiness     Severity:  Severe   Onset quality:  Gradual   Duration:  1 day   Timing:  Constant Severity:  Moderate Prior allergic episodes:  No prior episodes Context comment:  Ate some nuts last night but has not had trouble wtih food allergies in the past Relieved by:  Nothing   Past Medical History  Diagnosis Date  . Hypertension    History reviewed. No pertinent past surgical history. No family history on file. Social History  Substance Use Topics  . Smoking status: Never Smoker   . Smokeless tobacco: None  . Alcohol Use: None   OB History    No data available     Review of Systems  HENT: Negative for trouble swallowing.   Respiratory: Negative for wheezing.   Skin: Positive for itching and rash.  All other systems reviewed and are negative.     Allergies  Cephalexin  Home Medications   Prior to Admission medications   Medication Sig Start Date End Date Taking? Authorizing Provider  fluconazole (DIFLUCAN) 150 MG tablet Take 150 mg by mouth daily.   Yes Historical Provider, MD  Garlic 10 MG CAPS Take 1 capsule by mouth as needed.   Yes Historical Provider, MD  hydrochlorothiazide (HYDRODIURIL) 25 MG tablet Take 25 mg by mouth daily.   Yes Historical Provider, MD  metoprolol tartrate (LOPRESSOR) 25 MG tablet Take 25 mg by mouth 2 (two) times daily. 02/15/16  Yes Historical Provider, MD  diphenhydrAMINE (BENADRYL) 25 MG tablet Take 1 tablet (25 mg total) by mouth every 6 (six)  hours. 02/25/16   Dorie Rank, MD  famotidine (PEPCID) 20 MG tablet Take 1 tablet (20 mg total) by mouth 2 (two) times daily. 02/25/16   Dorie Rank, MD  predniSONE (DELTASONE) 50 MG tablet Take 1 tablet (50 mg total) by mouth daily. 02/25/16   Dorie Rank, MD   BP 129/87 mmHg  Pulse 93  Temp(Src) 99 F (37.2 C)  Resp 20  SpO2 100%  LMP 10/30/2010 Physical Exam  Constitutional: She appears well-developed and well-nourished. No distress.  HENT:  Head: Normocephalic and atraumatic.  Right Ear: External ear normal.  Left Ear: External ear normal.  Eyes: Conjunctivae are normal. Right eye exhibits no discharge. Left eye exhibits no discharge. No scleral icterus.  Neck: Neck supple. No tracheal deviation present.  Cardiovascular: Normal rate, regular rhythm and intact distal pulses.   Pulmonary/Chest: Effort normal and breath sounds normal. No stridor. No respiratory distress. She has no wheezes. She has no rales.  Abdominal: Soft. Bowel sounds are normal. She exhibits no distension. There is no tenderness. There is no rebound and no guarding.  Musculoskeletal: She exhibits no edema or tenderness.  Neurological: She is alert. She has normal strength. No cranial nerve deficit (no facial droop, extraocular movements intact, no slurred speech) or sensory deficit. She exhibits normal muscle tone. She displays no seizure activity. Coordination normal.  Skin: Skin is  warm and dry. Rash noted. Rash is urticarial. Rash is not pustular.  Psychiatric: She has a normal mood and affect.  Nursing note and vitals reviewed.   ED Course  Procedures (including critical care time) Labs Review Labs Reviewed  I-STAT CHEM 8, ED - Abnormal; Notable for the following:    Creatinine, Ser 1.50 (*)    Glucose, Bld 113 (*)    Hemoglobin 16.7 (*)    HCT 49.0 (*)    All other components within normal limits     MDM   Final diagnoses:  Urticaria    Labs show renal insufficiency.  This is improved compared to  prior values.   Pt was treated with steroids and antihistamines.  Sx have improved.  No breathing issues.  Feels better and ready to go home. Will dc home with referral to an allergist.  Avoid eating the nuts that she had last evening.    Dorie Rank, MD 02/25/16 1524

## 2016-02-25 NOTE — ED Notes (Signed)
Patient arrived with itching and hives that started last pm, no respiratory distress. Has not treated with any medication. No known source

## 2016-02-25 NOTE — Discharge Instructions (Signed)
Hives Hives are itchy, red, swollen areas of the skin. They can vary in size and location on your body. Hives can come and go for hours or several days (acute hives) or for several weeks (chronic hives). Hives do not spread from person to person (noncontagious). They may get worse with scratching, exercise, and emotional stress. CAUSES   Allergic reaction to food, additives, or drugs.  Infections, including the common cold.  Illness, such as vasculitis, lupus, or thyroid disease.  Exposure to sunlight, heat, or cold.  Exercise.  Stress.  Contact with chemicals. SYMPTOMS   Red or white swollen patches on the skin. The patches may change size, shape, and location quickly and repeatedly.  Itching.  Swelling of the hands, feet, and face. This may occur if hives develop deeper in the skin. DIAGNOSIS  Your caregiver can usually tell what is wrong by performing a physical exam. Skin or blood tests may also be done to determine the cause of your hives. In some cases, the cause cannot be determined. TREATMENT  Mild cases usually get better with medicines such as antihistamines. Severe cases may require an emergency epinephrine injection. If the cause of your hives is known, treatment includes avoiding that trigger.  HOME CARE INSTRUCTIONS   Avoid causes that trigger your hives.  Take antihistamines as directed by your caregiver to reduce the severity of your hives. Non-sedating or low-sedating antihistamines are usually recommended. Do not drive while taking an antihistamine.  Take any other medicines prescribed for itching as directed by your caregiver.  Wear loose-fitting clothing.  Keep all follow-up appointments as directed by your caregiver. SEEK MEDICAL CARE IF:   You have persistent or severe itching that is not relieved with medicine.  You have painful or swollen joints. SEEK IMMEDIATE MEDICAL CARE IF:   You have a fever.  Your tongue or lips are swollen.  You have  trouble breathing or swallowing.  You feel tightness in the throat or chest.  You have abdominal pain. These problems may be the first sign of a life-threatening allergic reaction. Call your local emergency services (911 in [place] MAKE SURE YOU:   Understand these instructions.  Will watch your condition.  Will get help right away if you are not doing well or get worse.   This information is not intended to replace advice given to you by your health care provider. Make sure you discuss any questions you have with your health care provider.   Document Released: 09/02/2005 Document Revised: 09/07/2013 Document Reviewed: 11/26/2011 Elsevier Interactive Patient Education 2016 [employer]  

## 2016-03-27 ENCOUNTER — Encounter: Payer: Self-pay | Admitting: Internal Medicine

## 2016-03-29 ENCOUNTER — Other Ambulatory Visit: Payer: Self-pay | Admitting: Internal Medicine

## 2016-03-29 DIAGNOSIS — Z1231 Encounter for screening mammogram for malignant neoplasm of breast: Secondary | ICD-10-CM

## 2016-05-16 ENCOUNTER — Encounter: Payer: Medicare HMO | Admitting: Internal Medicine

## 2016-05-17 ENCOUNTER — Encounter: Payer: Self-pay | Admitting: Internal Medicine

## 2016-05-28 ENCOUNTER — Inpatient Hospital Stay: Admission: RE | Admit: 2016-05-28 | Payer: Medicare HMO | Source: Ambulatory Visit

## 2016-06-24 ENCOUNTER — Ambulatory Visit
Admission: RE | Admit: 2016-06-24 | Discharge: 2016-06-24 | Disposition: A | Discharge status: Home / Self Care | Payer: Medicare HMO | Source: Ambulatory Visit | Attending: Internal Medicine | Admitting: Internal Medicine

## 2016-06-24 DIAGNOSIS — Z1231 Encounter for screening mammogram for malignant neoplasm of breast: Secondary | ICD-10-CM

## 2016-08-20 ENCOUNTER — Encounter: Payer: Self-pay | Admitting: Internal Medicine

## 2016-10-07 ENCOUNTER — Ambulatory Visit (AMBULATORY_SURGERY_CENTER): Payer: Self-pay

## 2016-10-07 VITALS — Ht 62.0 in | Wt 211.2 lb

## 2016-10-07 DIAGNOSIS — Z1211 Encounter for screening for malignant neoplasm of colon: Secondary | ICD-10-CM

## 2016-10-07 MED ORDER — SUPREP BOWEL PREP KIT 17.5-3.13-1.6 GM/177ML PO SOLN: 1.0000 | Freq: Once | ORAL | 0 refills | Status: AC

## 2016-10-07 NOTE — Progress Notes (Signed)
No allergies to eggs or soy No past problems with anesthesia No diet meds No home oxygen  Registered for emmi 

## 2016-10-21 ENCOUNTER — Encounter: Payer: Self-pay | Admitting: Internal Medicine

## 2016-10-21 ENCOUNTER — Ambulatory Visit (AMBULATORY_SURGERY_CENTER): Payer: Medicare HMO | Admitting: Internal Medicine

## 2016-10-21 VITALS — BP 112/64 | HR 61 | Temp 97.5°F | Resp 15 | Ht 62.0 in | Wt 211.0 lb

## 2016-10-21 DIAGNOSIS — K635 Polyp of colon: Secondary | ICD-10-CM

## 2016-10-21 DIAGNOSIS — D126 Benign neoplasm of colon, unspecified: Secondary | ICD-10-CM | POA: Diagnosis not present

## 2016-10-21 DIAGNOSIS — D125 Benign neoplasm of sigmoid colon: Secondary | ICD-10-CM

## 2016-10-21 DIAGNOSIS — Z1212 Encounter for screening for malignant neoplasm of rectum: Secondary | ICD-10-CM | POA: Diagnosis not present

## 2016-10-21 DIAGNOSIS — D123 Benign neoplasm of transverse colon: Secondary | ICD-10-CM | POA: Diagnosis not present

## 2016-10-21 DIAGNOSIS — Z1211 Encounter for screening for malignant neoplasm of colon: Secondary | ICD-10-CM

## 2016-10-21 MED ORDER — SODIUM CHLORIDE 0.9 % IV SOLN: 500.0000 mL | INTRAVENOUS | Status: DC

## 2016-10-21 NOTE — Patient Instructions (Addendum)
Discharge instructions given. Handouts on polyps and hemorrhoids. Resume previous medications. YOU HAD AN ENDOSCOPIC PROCEDURE TODAY AT THE [facility]:   Refer to the procedure report that was given to you for any specific questions about what was found during the examination.  If the procedure report does not answer your questions, please call your gastroenterologist to clarify.  If you requested that your care partner not be given the details of your procedure findings, then the procedure report has been included in a sealed envelope for you to review at your convenience later.  YOU SHOULD EXPECT: Some feelings of bloating in the abdomen. Passage of more gas than usual.  Walking can help get rid of the air that was put into your GI tract during the procedure and reduce the bloating. If you had a lower endoscopy (such as a colonoscopy or flexible sigmoidoscopy) you may notice spotting of blood in your stool or on the toilet paper. If you underwent a bowel prep for your procedure, you may not have a normal bowel movement for a few days.  Please Note:  You might notice some irritation and congestion in your nose or some drainage.  This is from the oxygen used during your procedure.  There is no need for concern and it should clear up in a day or so.  SYMPTOMS TO REPORT IMMEDIATELY:   Following lower endoscopy (colonoscopy or flexible sigmoidoscopy):  Excessive amounts of blood in the stool  Significant tenderness or worsening of abdominal pains  Swelling of the abdomen that is new, acute  Fever of 100F or higher   For urgent or emergent issues, a gastroenterologist can be reached at any hour by calling [phone].   DIET:  We do recommend a small meal at first, but then you may proceed to your regular diet.  Drink plenty of fluids but you should avoid alcoholic beverages for 24 hours.  ACTIVITY:  You should plan to take it easy for the rest of today and you should NOT DRIVE  or use heavy machinery until tomorrow (because of the sedation medicines used during the test).    FOLLOW UP: Our staff will call the number listed on your records the next business day following your procedure to check on you and address any questions or concerns that you may have regarding the information given to you following your procedure. If we do not reach you, we will leave a message.  However, if you are feeling well and you are not experiencing any problems, there is no need to return our call.  We will assume that you have returned to your regular daily activities without incident.  If any biopsies were taken you will be contacted by phone or by letter within the next 1-3 weeks.  Please call us at [phone] if you have not heard about the biopsies in 3 weeks.    SIGNATURES/CONFIDENTIALITY: You and/or your care partner have signed paperwork which will be entered into your electronic medical record.  These signatures attest to the fact that that the information above on your After Visit Summary has been reviewed and is understood.  Full responsibility of the confidentiality of this discharge information lies with you and/or your care-partner. 

## 2016-10-21 NOTE — Progress Notes (Signed)
Report given to PACU, vss 

## 2016-10-21 NOTE — Progress Notes (Signed)
Called to room to assist during endoscopic procedure.  Patient ID and intended procedure confirmed with present staff. Received instructions for my participation in the procedure from the performing physician.  

## 2016-10-21 NOTE — Op Note (Signed)
[facility] Patient Name: Diana Mack Procedure Date: 10/21/2016 11:47 AM MRN: [id number] Endoscopist: Jerene Bears , MD Age: 56 Referring MD:  Date of Birth: 10/09/1960 Gender: Female Account #: [id number] Procedure:                Colonoscopy Indications:              Screening for colorectal malignant neoplasm, This                            is the patient's first colonoscopy Medicines:                Monitored Anesthesia Care Procedure:                Pre-Anesthesia Assessment:                           - Prior to the procedure, a History and Physical                            was performed, and patient medications and                            allergies were reviewed. The patient's tolerance of                            previous anesthesia was also reviewed. The risks                            and benefits of the procedure and the sedation                            options and risks were discussed with the patient.                            All questions were answered, and informed consent                            was obtained. Prior Anticoagulants: The patient has                            taken no previous anticoagulant or antiplatelet                            agents. ASA Grade Assessment: II - A patient with                            mild systemic disease. After reviewing the risks                            and benefits, the patient was deemed in                            satisfactory condition to undergo the procedure.  After obtaining informed consent, the colonoscope                            was passed under direct vision. Throughout the                            procedure, the patient's blood pressure, pulse, and                            oxygen saturations were monitored continuously. The                            Model PCF-H190DL [id number]) scope was introduced                            through the anus and  advanced to the the cecum,                            identified by appendiceal orifice and ileocecal                            valve. The colonoscopy was performed without                            difficulty. The patient tolerated the procedure                            well. The quality of the bowel preparation was                            good. The ileocecal valve, appendiceal orifice, and                            rectum were photographed. Scope In: 12:01:30 PM Scope Out: 12:14:48 PM Scope Withdrawal Time: 0 hours 8 minutes 39 seconds  Total Procedure Duration: 0 hours 13 minutes 18 seconds  Findings:                 The digital rectal exam was normal.                           A 5 mm polyp was found in the hepatic flexure. The                            polyp was sessile. The polyp was removed with a                            cold snare. Resection and retrieval were complete.                           A 4 mm polyp was found in the sigmoid colon. The                            polyp was sessile.  The polyp was removed with a                            cold snare. Resection and retrieval were complete.                           Internal hemorrhoids were found during                            retroflexion. The hemorrhoids were small.                           The exam was otherwise without abnormality. Complications:            No immediate complications. Estimated Blood Loss:     Estimated blood loss was minimal. Impression:               - One 5 mm polyp at the hepatic flexure, removed                            with a cold snare. Resected and retrieved.                           - One 4 mm polyp in the sigmoid colon, removed with                            a cold snare. Resected and retrieved.                           - Internal hemorrhoids.                           - The examination was otherwise normal. Recommendation:           - Patient has a contact number available  for                            emergencies. The signs and symptoms of potential                            delayed complications were discussed with the                            patient. Return to normal activities tomorrow.                            Written discharge instructions were provided to the                            patient.                           - Resume previous diet.                           - Continue present medications.                           -  Await pathology results.                           - Repeat colonoscopy is recommended. The                            colonoscopy date will be determined after pathology                            results from today's exam become available for                            review. Jerene Bears, MD 10/21/2016 12:17:08 PM This report has been signed electronically.

## 2016-10-22 ENCOUNTER — Telehealth: Payer: Self-pay

## 2016-10-22 NOTE — Telephone Encounter (Signed)
Called patient for follow-up. No answer, unable to leave message due to voice mail not being set up. Will attempt to reach her this afternoon.

## 2016-10-22 NOTE — Telephone Encounter (Signed)
  Follow up Call-  Call back number 10/21/2016  Post procedure Call Back phone  # [phone]  Permission to leave phone message Yes  Some recent data might be hidden    Patient was called for follow up after her procedure on 10/21/2016. No answer at the number given for follow up phone call. I was not able to leave a message.

## 2016-10-25 ENCOUNTER — Encounter: Payer: Self-pay | Admitting: Internal Medicine

## 2016-11-28 ENCOUNTER — Encounter (HOSPITAL_COMMUNITY): Payer: Self-pay

## 2016-11-28 ENCOUNTER — Emergency Department (HOSPITAL_COMMUNITY)
Admission: EM | Admit: 2016-11-28 | Discharge: 2016-11-28 | Disposition: A | Discharge status: Home / Self Care | Payer: Medicare HMO | Attending: Emergency Medicine | Admitting: Emergency Medicine

## 2016-11-28 DIAGNOSIS — Z79899 Other long term (current) drug therapy: Secondary | ICD-10-CM | POA: Diagnosis not present

## 2016-11-28 DIAGNOSIS — I10 Essential (primary) hypertension: Secondary | ICD-10-CM | POA: Diagnosis not present

## 2016-11-28 DIAGNOSIS — T7840XA Allergy, unspecified, initial encounter: Secondary | ICD-10-CM | POA: Diagnosis not present

## 2016-11-28 MED ORDER — FAMOTIDINE IN NACL 20-0.9 MG/50ML-% IV SOLN
20.0000 mg | Freq: Once | INTRAVENOUS | Status: AC
  Administered 2016-11-28: 20 mg via INTRAVENOUS
  Filled 2016-11-28: qty 50

## 2016-11-28 MED ORDER — EPINEPHRINE 0.15 MG/0.15ML IJ SOAJ: 0.1500 mg | INTRAMUSCULAR | 0 refills | Status: AC | PRN

## 2016-11-28 MED ORDER — METHYLPREDNISOLONE SODIUM SUCC 125 MG IJ SOLR
125.0000 mg | Freq: Once | INTRAMUSCULAR | Status: AC
  Administered 2016-11-28: 125 mg via INTRAVENOUS
  Filled 2016-11-28: qty 2

## 2016-11-28 MED ORDER — DIPHENHYDRAMINE HCL 50 MG/ML IJ SOLN
25.0000 mg | Freq: Once | INTRAMUSCULAR | Status: AC
  Administered 2016-11-28: 25 mg via INTRAVENOUS
  Filled 2016-11-28: qty 1

## 2016-11-28 MED ORDER — PREDNISONE 50 MG PO TABS: 50.0000 mg | ORAL_TABLET | Freq: Every day | ORAL | 0 refills | Status: AC

## 2016-11-28 NOTE — ED Provider Notes (Signed)
[facility] DEPT Provider Note   CSN: [id number] Arrival date & time: 11/28/16  1325 By signing my name below, I, [name], attest that this documentation has been prepared under the direction and in the presence of Courtney Paris, MD. Electronically Signed: [name], Scribe. 11/28/2016. 2:36 PM.  History   Chief Complaint Chief Complaint  Patient presents with  . Allergic Reaction   The history is provided by the patient and medical records. No language interpreter was used.    HPI Comments:  Diana Mack is an obese 56 y.o. female with a PMHx of HTN, who presents to the Emergency Department complaining of multiple areas of gradually worsening, redness and swelling onset this morning. Pt reports the hives to her chest, arms, legs, and back perhaps stemming from a kiwi/strawberry drink she consumed last night. She also reports recent grape tomato ingestion. She tried liquid Benadryl at home with minimal relief of her pain along with her blood pressure medication. No new soaps, lotions, detergents, foods, animals, plants, or medications used. Pt denies SOB, throat swelling, trouble swallowing, nausea, vomiting, lightheadedness, syncope, abdominal pain, CP, and any other complaints at this time.   Past Medical History:  Diagnosis Date  . History of blood transfusion    surgery scar tissue abdominal was on Coumadin and was snicked during surgery  . Hypertension    Patient Active Problem List   Diagnosis Date Noted  . Pulmonary embolism ([place]) 01/01/2012  . Hypertension 01/01/2012   Past Surgical History:  Procedure Laterality Date  . ABDOMINAL ADHESION SURGERY     OB History    No data available     Home Medications    Prior to Admission medications   Medication Sig Start Date End Date Taking? Authorizing Provider  diphenhydrAMINE (BENADRYL) 25 MG tablet Take 1 tablet (25 mg total) by mouth every 6 (six) hours. 02/25/16   Dorie Rank, MD    hydrochlorothiazide (HYDRODIURIL) 25 MG tablet Take 25 mg by mouth daily.    Historical Provider, MD  lovastatin (MEVACOR) 20 MG tablet Take 20 mg by mouth at bedtime.    Historical Provider, MD  metoprolol tartrate (LOPRESSOR) 25 MG tablet Take 25 mg by mouth 2 (two) times daily. 02/15/16   Historical Provider, MD    Family History Family History  Problem Relation Age of Onset  . Heart disease Father   . Heart disease Paternal Grandfather   . Colon cancer Neg Hx     Social History Social History  Substance Use Topics  . Smoking status: Never Smoker  . Smokeless tobacco: Never Used  . Alcohol use No     Allergies   Cephalexin   Review of Systems Review of Systems  Constitutional: Negative for chills and fever.  HENT: Negative for congestion and trouble swallowing.        Negative throat swelling  Eyes: Negative for visual disturbance.  Respiratory: Negative for cough, chest tightness, shortness of breath, wheezing and stridor.   Cardiovascular: Negative for chest pain and palpitations.  Gastrointestinal: Negative for abdominal pain, nausea and vomiting.  Genitourinary: Negative for dysuria.  Musculoskeletal: Negative for back pain.  Skin: Positive for rash.  Neurological: Negative for dizziness, syncope, light-headedness, numbness and headaches.  All other systems reviewed and are negative.    Physical Exam Updated Vital Signs BP 139/74 (BP Location: Left Arm)   Pulse 62   Temp 99.2 F (37.3 C) (Oral)   Resp 20   Ht 5\' 2"  (1.575  m)   Wt 209 lb (94.8 kg)   LMP 10/30/2010   SpO2 100%   BMI 38.23 kg/m   Physical Exam  Constitutional: She is oriented to person, place, and time. She appears well-developed and well-nourished. No distress.  HENT:  Head: Normocephalic and atraumatic.  Right Ear: External ear normal.  Left Ear: External ear normal.  Nose: Nose normal.  Mouth/Throat: Uvula is midline and oropharynx is clear and moist. No oropharyngeal exudate,  posterior oropharyngeal edema or posterior oropharyngeal erythema. No tonsillar exudate.  No lip or tongue edema or erythema.   Eyes: Conjunctivae and EOM are normal. Pupils are equal, round, and reactive to light.  Neck: Normal range of motion. Neck supple.  Cardiovascular: Normal rate, normal heart sounds and intact distal pulses.   No murmur heard. Pulmonary/Chest: Effort normal and breath sounds normal. No stridor. No respiratory distress. She has no wheezes. She exhibits no tenderness.  Abdominal: Soft. She exhibits no distension. There is no tenderness. There is no rebound.  Musculoskeletal: She exhibits no tenderness.  Neurological: She is alert and oriented to person, place, and time. She has normal reflexes. She exhibits normal muscle tone. Coordination normal.  Skin: Skin is warm. Capillary refill takes less than 2 seconds. No rash noted. She is not diaphoretic. No erythema.  Diffuse urticaria throughout the body.    Psychiatric: She has a normal mood and affect.  Nursing note and vitals reviewed.   ED Treatments / Results  DIAGNOSTIC STUDIES:  Oxygen Saturation is 100% on RA, normal by my interpretation.    COORDINATION OF CARE:  2:35 PM Discussed treatment plan with pt at bedside including Benadryl, Pepcid, and Solu-Medrol and pt agreed to plan.  Labs (all labs ordered are listed, but only abnormal results are displayed) Labs Reviewed - No data to display  EKG  EKG Interpretation None       Radiology No results found.  Procedures Procedures (including critical care time)  Medications Ordered in ED Medications  famotidine (PEPCID) IVPB 20 mg premix (20 mg Intravenous New Bag/Given 11/28/16 1500)  methylPREDNISolone sodium succinate (SOLU-MEDROL) 125 mg/2 mL injection 125 mg (125 mg Intravenous Given 11/28/16 1455)  diphenhydrAMINE (BENADRYL) injection 25 mg (25 mg Intravenous Given 11/28/16 1455)     Initial Impression / Assessment and Plan / ED Course  I  have reviewed the triage vital signs and the nursing notes.  Pertinent labs & imaging results that were available during my care of the patient were reviewed by me and considered in my medical decision making (see chart for details).     Diana Mack is an obese 56 y.o. female with a PMHx of HTN, who presents to the Emergency Department complaining of multiple areas of gradually worsening, redness and swelling onset this morning concerning for allergic reaction. Patient repo she drank a drinkKiwi, strawberries, and ate tomatoes yesterday. She denies any known allergies.  On exam, patient has a diffuse urticarial rash on her entire body. No evidence of conjunctivitis or intraoral rashes.Lungs clear with no wheezing. No abdominal tenderness. No focal neurologic deficits or other abnormalities on exam.  Based on patient's worsening rash today, suspect allergic reaction. Patient took Benadryl at home without significant improvement. Patient will be given Benadryl, Pepcid, and steroids.   After the medications, patient felt much imroved. No evidence of airway compromise or throat swelling. Do not feel patient needs epinephrine at this time.  Patient observed for a period time with significant improvement in rash and pruritis. Patient  will be given prescription for EpiPen and steroids and was instructed to take over-the-counter Benadryl to manage her allergic reaction. Patient instructed to follow-up with a primary care physiciy testing and further management. Patient given strict return precautions for any new or worsening symptoms.  Patient understood plan of care, and had no other questions or concerns.patient discharged in good condition with significant improvement in her presenting symptoms of allergic reaction.     Final Clinical Impressions(s) / ED Diagnoses   Final diagnoses:  Allergic reaction, initial encounter    New Prescriptions New Prescriptions   EPINEPHRINE 0.15 MG/0.15ML  IJ INJECTION    Inject 0.15 mLs (0.15 mg total) into the muscle as needed for anaphylaxis.   PREDNISONE (DELTASONE) 50 MG TABLET    Take 1 tablet (50 mg total) by mouth daily.   I personally performed the services described in this documentation, which was scribed in my presence. The recorded information has been reviewed and is accurate.  Clinical Impression: 1. Allergic reaction, initial encounter     Disposition: Discharge  Condition: Good  I have discussed the results, Dx and Tx plan with the pt(& family if present). He/she/they expressed understanding and agree(s) with the plan. Discharge instructions discussed at great length. Strict return precautions discussed and pt &/or family have verbalized understanding of the instructions. No further questions at time of discharge.    New Prescriptions   EPINEPHRINE 0.15 MG/0.15ML IJ INJECTION    Inject 0.15 mLs (0.15 mg total) into the muscle as needed for anaphylaxis.   PREDNISONE (DELTASONE) 50 MG TABLET    Take 1 tablet (50 mg total) by mouth daily.    Follow Up: Antonietta Jewel, MD [address] [place] [address] [phone]     [place] [address] [id number] [place] [phone]  If symptoms worsen      Courtney Paris, MD 11/28/16 2144

## 2016-11-28 NOTE — Discharge Instructions (Signed)
Please take your steroids daily for the next 5 days. Please fill your prescription for EpiPen in case you have a reaction that involved your breathing and airway. Please take Benadryl for the next 5 days to treat your rash and reaction. Please schedule an appointment with your primary care physician for allergy testing and further management. If any symptoms acutely worsen, please return to the nearest emergency department.

## 2016-11-28 NOTE — ED Notes (Signed)
Pt presents with hives scattered throughout her body: chest, arms, legs, and back. She states she woke up and noticed them. She thinks it may be from a kiwi drink she drank last night. Denies sob or throat irritation. Pt speaking in clear complete sentences.

## 2017-05-14 ENCOUNTER — Emergency Department (HOSPITAL_COMMUNITY): Payer: Medicare HMO

## 2017-05-14 ENCOUNTER — Emergency Department (HOSPITAL_COMMUNITY)
Admission: EM | Admit: 2017-05-14 | Discharge: 2017-05-14 | Disposition: A | Discharge status: Home / Self Care | Payer: Medicare HMO | Attending: Emergency Medicine | Admitting: Emergency Medicine

## 2017-05-14 ENCOUNTER — Encounter (HOSPITAL_COMMUNITY): Payer: Self-pay | Admitting: Emergency Medicine

## 2017-05-14 DIAGNOSIS — S63501A Unspecified sprain of right wrist, initial encounter: Secondary | ICD-10-CM

## 2017-05-14 DIAGNOSIS — Y999 Unspecified external cause status: Secondary | ICD-10-CM | POA: Diagnosis not present

## 2017-05-14 DIAGNOSIS — S8002XA Contusion of left knee, initial encounter: Secondary | ICD-10-CM

## 2017-05-14 DIAGNOSIS — S5001XA Contusion of right elbow, initial encounter: Secondary | ICD-10-CM | POA: Diagnosis not present

## 2017-05-14 DIAGNOSIS — Y9289 Other specified places as the place of occurrence of the external cause: Secondary | ICD-10-CM | POA: Diagnosis not present

## 2017-05-14 DIAGNOSIS — W01198A Fall on same level from slipping, tripping and stumbling with subsequent striking against other object, initial encounter: Secondary | ICD-10-CM | POA: Insufficient documentation

## 2017-05-14 DIAGNOSIS — Y9389 Activity, other specified: Secondary | ICD-10-CM | POA: Insufficient documentation

## 2017-05-14 DIAGNOSIS — Z79899 Other long term (current) drug therapy: Secondary | ICD-10-CM | POA: Diagnosis not present

## 2017-05-14 DIAGNOSIS — W19XXXA Unspecified fall, initial encounter: Secondary | ICD-10-CM

## 2017-05-14 DIAGNOSIS — Z23 Encounter for immunization: Secondary | ICD-10-CM | POA: Diagnosis not present

## 2017-05-14 DIAGNOSIS — I10 Essential (primary) hypertension: Secondary | ICD-10-CM | POA: Diagnosis not present

## 2017-05-14 DIAGNOSIS — S59901A Unspecified injury of right elbow, initial encounter: Secondary | ICD-10-CM | POA: Diagnosis present

## 2017-05-14 MED ORDER — BACITRACIN ZINC 500 UNIT/GM EX OINT
TOPICAL_OINTMENT | Freq: Once | CUTANEOUS | Status: AC
  Administered 2017-05-14: 1 via TOPICAL
  Filled 2017-05-14: qty 2.7

## 2017-05-14 MED ORDER — TETANUS-DIPHTH-ACELL PERTUSSIS 5-2.5-18.5 LF-MCG/0.5 IM SUSP
0.5000 mL | Freq: Once | INTRAMUSCULAR | Status: AC
  Administered 2017-05-14: 0.5 mL via INTRAMUSCULAR
  Filled 2017-05-14: qty 0.5

## 2017-05-14 MED ORDER — DICLOFENAC SODIUM 50 MG PO TBEC: 50.0000 mg | DELAYED_RELEASE_TABLET | Freq: Two times a day (BID) | ORAL | 0 refills | Status: DC

## 2017-05-14 MED ORDER — HYDROCODONE-ACETAMINOPHEN 5-325 MG PO TABS
1.0000 | ORAL_TABLET | Freq: Once | ORAL | Status: AC
  Administered 2017-05-14: 1 via ORAL
  Filled 2017-05-14: qty 1

## 2017-05-14 NOTE — ED Notes (Signed)
Wound care (baci and bandage) performed by RN Learta Codding,

## 2017-05-14 NOTE — Discharge Instructions (Signed)
Follow up with your doctor.  Return here as needed 

## 2017-05-14 NOTE — ED Notes (Signed)
Patient transported to X-ray 

## 2017-05-14 NOTE — ED Provider Notes (Signed)
[facility] DEPT Provider Note   CSN: [id number] Arrival date & time: 05/14/17  1231     History   Chief Complaint Chief Complaint  Patient presents with  . Fall    HPI Diana Mack is a 56 y.o. female who presents to the ED s/p fall. Patient reports she was down town [place] and got out of her car and the pavement was uneven and caused her to fall. Patient complains of pain to the right hand, right elbow and left knee. Patient has taken nothing for pain since the fall. The accident occurred just prior to arrival to the ED.   The history is provided by the patient. No language interpreter was used.  Fall  This is a new problem. The current episode started less than 1 hour ago. The problem has been gradually worsening. Pertinent negatives include no chest pain, no abdominal pain, no headaches and no shortness of breath. The symptoms are aggravated by walking. Nothing relieves the symptoms. She has tried nothing for the symptoms.    Past Medical History:  Diagnosis Date  . History of blood transfusion    surgery scar tissue abdominal was on Coumadin and was snicked during surgery  . Hypertension     Patient Active Problem List   Diagnosis Date Noted  . Pulmonary embolism ([place]) 01/01/2012  . Hypertension 01/01/2012    Past Surgical History:  Procedure Laterality Date  . ABDOMINAL ADHESION SURGERY      OB History    No data available       Home Medications    Prior to Admission medications   Medication Sig Start Date End Date Taking? Authorizing Provider  hydrochlorothiazide (HYDRODIURIL) 25 MG tablet Take 25 mg by mouth daily.   Yes [provider]  lovastatin (MEVACOR) 20 MG tablet Take 20 mg by mouth at bedtime.   Yes [provider]  metoprolol tartrate (LOPRESSOR) 25 MG tablet Take 25 mg by mouth 2 (two) times daily. 02/15/16  Yes [provider]  diclofenac (VOLTAREN) 50 MG EC tablet Take 1 tablet (50 mg total) by mouth 2  (two) times daily. 05/14/17   Ashley Murrain, NP  diphenhydrAMINE (BENADRYL) 25 MG tablet Take 1 tablet (25 mg total) by mouth every 6 (six) hours. 02/25/16   Dorie Rank, MD  EPINEPHrine 0.15 MG/0.15ML IJ injection Inject 0.15 mLs (0.15 mg total) into the muscle as needed for anaphylaxis. 11/28/16   Tegeler, Gwenyth Allegra, MD    Family History Family History  Problem Relation Age of Onset  . Heart disease Father   . Heart disease Paternal Grandfather   . Colon cancer Neg Hx     Social History Social History  Substance Use Topics  . Smoking status: Never Smoker  . Smokeless tobacco: Never Used  . Alcohol use No     Allergies   Cephalexin and Sulfa antibiotics   Review of Systems Review of Systems  Constitutional: Negative for diaphoresis.  HENT: Negative.   Eyes: Negative for visual disturbance.  Respiratory: Negative for shortness of breath.   Cardiovascular: Negative for chest pain.  Gastrointestinal: Negative for abdominal pain, nausea and vomiting.  Genitourinary:       No loss of control of bladder or bowels.  Musculoskeletal: Positive for arthralgias.  Skin: Positive for wound.  Neurological: Negative for syncope and headaches.  Psychiatric/Behavioral: Negative for confusion.     Physical Exam Updated Vital Signs BP (!) 162/91 (BP Location: Left Arm)   Pulse 62  Temp 98.7 F (37.1 C) (Oral)   Resp 18   Ht 5\' 2"  (1.575 m)   Wt 95.3 kg (210 lb)   LMP 10/30/2010   SpO2 100%   BMI 38.41 kg/m   Physical Exam  Constitutional: She is oriented to person, place, and time. She appears well-developed and well-nourished. No distress.  HENT:  Head: Normocephalic and atraumatic.  Eyes: EOM are normal.  Neck: Normal range of motion. Neck supple.  Cardiovascular: Normal rate and regular rhythm.   Pulmonary/Chest: Effort normal and breath sounds normal.  Abdominal: Soft. Bowel sounds are normal. There is no tenderness. There is no CVA tenderness.  Musculoskeletal:  Normal range of motion.       Right elbow: She exhibits laceration (abrasion posterior aspect). She exhibits normal range of motion, no swelling and no deformity. Tenderness found. Olecranon process tenderness noted.       Right wrist: She exhibits tenderness and bony tenderness. She exhibits normal range of motion, no crepitus and no laceration.       Left knee: She exhibits normal range of motion, no swelling, no ecchymosis, no deformity and normal alignment. Lacerations: abrasion. Tenderness found.  Pedal pulses 2+  Neurological: She is alert and oriented to person, place, and time. No cranial nerve deficit.  Skin: Skin is warm and dry.  Psychiatric: She has a normal mood and affect. Her behavior is normal.  Nursing note and vitals reviewed.    ED Treatments / Results  Labs (all labs ordered are listed, but only abnormal results are displayed) Labs Reviewed - No data to display  Radiology Dg Elbow Complete Right  Result Date: 05/14/2017 CLINICAL DATA:  Patient tripped and fell today.  Pain. EXAM: RIGHT ELBOW - COMPLETE 3+ VIEW COMPARISON:  None. FINDINGS: There is no evidence of fracture, dislocation, or joint effusion. There is no evidence of arthropathy or other focal bone abnormality. Soft tissues are unremarkable. IMPRESSION: No acute fracture, joint effusion or dislocations of the right elbow. Electronically Signed   By: Ashley Royalty M.D.   On: 05/14/2017 14:35   Dg Wrist Complete Right  Result Date: 05/14/2017 CLINICAL DATA:  Patient tripped and fell on pavement today.  Pain. EXAM: RIGHT WRIST - COMPLETE 3+ VIEW COMPARISON:  None. FINDINGS: There is no evidence of fracture or dislocation. 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 There is no evidence of arthropathy or other focal bone abnormality. Soft tissues are unremarkable. IMPRESSION: Negative for acute fracture. 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  Electronically Signed   By: Ashley Royalty M.D.   On: 05/14/2017 14:34   Dg Knee Complete 4 Views Left  Result Date: 05/14/2017 CLINICAL DATA:  Patient tripped and fell today.  Knee pain. EXAM: LEFT KNEE - COMPLETE 4+ VIEW COMPARISON:  None. FINDINGS: No evidence of fracture, dislocation, or joint effusion. No evidence of arthropathy or other focal bone abnormality. Soft tissues are unremarkable. IMPRESSION: Negative for acute fracture dislocations.  No joint effusion. Electronically Signed   By: Ashley Royalty M.D.   On: 05/14/2017 14:36    Procedures Procedures (including critical care time)  Medications Ordered in ED Medications  bacitracin ointment (not administered)  HYDROcodone-acetaminophen (NORCO/VICODIN) 5-325 MG per tablet 1 tablet (1 tablet Oral Given 05/14/17 1346)  Tdap (BOOSTRIX) injection 0.5 mL (0.5 mLs Intramuscular Given 05/14/17 1346)     Initial Impression / Assessment and Plan / ED Course  I have reviewed the triage vital signs and  the nursing notes.  Pertinent imaging results that were available during my care of the patient were reviewed by me and considered in my medical decision making (see chart for details).  56 y.o. female with pain to the right elbow, right wrist, left knee s/p fall stable for d/c without fracture or dislocation noted on x-rays. Wound cleaned, knee brace to left knee, wrist splint to right wrist. Patient to f/u with her PCP or return here for worsening symptoms.    Final Clinical Impressions(s) / ED Diagnoses   Final diagnoses:  Fall, initial encounter  Contusion of left knee, initial encounter  Contusion of right elbow, initial encounter  Wrist sprain, right, initial encounter    New Prescriptions New Prescriptions   DICLOFENAC (VOLTAREN) 50 MG EC TABLET    Take 1 tablet (50 mg total) by mouth 2 (two) times daily.     Debroah Baller Rea, [place] 05/14/17 1514    Quintella Reichert, MD 05/15/17 [phone]

## 2017-05-14 NOTE — ED Triage Notes (Signed)
Pt reports that she tripped and fell on pavement. Denies LOC. C/o R arm and hand pain, L knee pain. Denies neck or back pain.

## 2017-06-23 ENCOUNTER — Other Ambulatory Visit: Payer: Self-pay | Admitting: Internal Medicine

## 2017-06-23 DIAGNOSIS — Z1231 Encounter for screening mammogram for malignant neoplasm of breast: Secondary | ICD-10-CM

## 2017-07-03 ENCOUNTER — Ambulatory Visit: Payer: Medicare HMO

## 2017-07-24 ENCOUNTER — Ambulatory Visit
Admission: RE | Admit: 2017-07-24 | Discharge: 2017-07-24 | Disposition: A | Discharge status: Home / Self Care | Payer: Medicare HMO | Source: Ambulatory Visit | Attending: Internal Medicine | Admitting: Internal Medicine

## 2017-07-24 DIAGNOSIS — Z1231 Encounter for screening mammogram for malignant neoplasm of breast: Secondary | ICD-10-CM

## 2018-06-10 ENCOUNTER — Ambulatory Visit (HOSPITAL_COMMUNITY)
Admission: EM | Admit: 2018-06-10 | Discharge: 2018-06-10 | Disposition: A | Discharge status: Home / Self Care | Payer: Medicare HMO | Attending: Family Medicine | Admitting: Family Medicine

## 2018-06-10 ENCOUNTER — Encounter (HOSPITAL_COMMUNITY): Payer: Self-pay | Admitting: Emergency Medicine

## 2018-06-10 DIAGNOSIS — X58XXXA Exposure to other specified factors, initial encounter: Secondary | ICD-10-CM | POA: Diagnosis not present

## 2018-06-10 DIAGNOSIS — Z881 Allergy status to other antibiotic agents status: Secondary | ICD-10-CM | POA: Diagnosis not present

## 2018-06-10 DIAGNOSIS — Z882 Allergy status to sulfonamides status: Secondary | ICD-10-CM | POA: Diagnosis not present

## 2018-06-10 DIAGNOSIS — T148XXA Other injury of unspecified body region, initial encounter: Secondary | ICD-10-CM

## 2018-06-10 DIAGNOSIS — R102 Pelvic and perineal pain: Secondary | ICD-10-CM

## 2018-06-10 DIAGNOSIS — Z8249 Family history of ischemic heart disease and other diseases of the circulatory system: Secondary | ICD-10-CM | POA: Insufficient documentation

## 2018-06-10 DIAGNOSIS — Z79899 Other long term (current) drug therapy: Secondary | ICD-10-CM | POA: Insufficient documentation

## 2018-06-10 DIAGNOSIS — S39013A Strain of muscle, fascia and tendon of pelvis, initial encounter: Secondary | ICD-10-CM | POA: Diagnosis not present

## 2018-06-10 DIAGNOSIS — I10 Essential (primary) hypertension: Secondary | ICD-10-CM | POA: Diagnosis not present

## 2018-06-10 LAB — POCT URINALYSIS DIP (DEVICE)
Bilirubin Urine: NEGATIVE
Glucose, UA: NEGATIVE mg/dL
Ketones, ur: NEGATIVE mg/dL
LEUKOCYTES UA: NEGATIVE
NITRITE: NEGATIVE
PH: 5.5 (ref 5.0–8.0)
PROTEIN: NEGATIVE mg/dL
SPECIFIC GRAVITY, URINE: 1.02 (ref 1.005–1.030)
Urobilinogen, UA: 0.2 mg/dL (ref 0.0–1.0)

## 2018-06-10 MED ORDER — NAPROXEN 500 MG PO TABS: 500.0000 mg | ORAL_TABLET | Freq: Two times a day (BID) | ORAL | 0 refills | Status: DC

## 2018-06-10 NOTE — ED Provider Notes (Signed)
[facility]    CSN: [id number] Arrival date & time: 06/10/18  1409     History   Chief Complaint Chief Complaint  Patient presents with  . Pelvic Pain    HPI Diana Mack is a 57 y.o. female.   Patient is a 57 year old female that presents with 3 days of right groin pain.  The pain has been intermittent but not worsening.  The pain is worse with moving, bending.  She did help her sister move some furniture recently.  She is here with concerns of urinary tract infection.  She denies any dysuria, hematuria, abdominal pain, back pain, fevers, chills.  She denies any vaginal bleeding or discharge.  She is currently sexually active, unprotected.  She is postmenopausal and has not had a period since 2012.  She is having slight vaginal irritation without itching but reports was recently treated for a yeast infection.  ROS per HPI       Past Medical History:  Diagnosis Date  . History of blood transfusion    surgery scar tissue abdominal was on Coumadin and was snicked during surgery  . Hypertension     Patient Active Problem List   Diagnosis Date Noted  . Pulmonary embolism ([place]) 01/01/2012  . Hypertension 01/01/2012    Past Surgical History:  Procedure Laterality Date  . ABDOMINAL ADHESION SURGERY      OB History   None      Home Medications    Prior to Admission medications   Medication Sig Start Date End Date Taking? Authorizing Provider  diclofenac (VOLTAREN) 50 MG EC tablet Take 1 tablet (50 mg total) by mouth 2 (two) times daily. 05/14/17   Ashley Murrain, NP  diphenhydrAMINE (BENADRYL) 25 MG tablet Take 1 tablet (25 mg total) by mouth every 6 (six) hours. 02/25/16   Dorie Rank, MD  EPINEPHrine 0.15 MG/0.15ML IJ injection Inject 0.15 mLs (0.15 mg total) into the muscle as needed for anaphylaxis. 11/28/16   Tegeler, Gwenyth Allegra, MD  hydrochlorothiazide (HYDRODIURIL) 25 MG tablet Take 25 mg by mouth daily.    [provider]  lovastatin  (MEVACOR) 20 MG tablet Take 20 mg by mouth at bedtime.    [provider]  metoprolol tartrate (LOPRESSOR) 25 MG tablet Take 25 mg by mouth 2 (two) times daily. 02/15/16   [provider]  naproxen (NAPROSYN) 500 MG tablet Take 1 tablet (500 mg total) by mouth 2 (two) times daily. 06/10/18   Orvan July, NP    Family History Family History  Problem Relation Age of Onset  . Heart disease Father   . Heart disease Paternal Grandfather   . Colon cancer Neg Hx     Social History Social History   Tobacco Use  . Smoking status: Never Smoker  . Smokeless tobacco: Never Used  Substance Use Topics  . Alcohol use: No  . Drug use: No     Allergies   Cephalexin and Sulfa antibiotics   Review of Systems Review of Systems   Physical Exam Triage Vital Signs ED Triage Vitals [06/10/18 1444]  Enc Vitals Group     BP 133/77     Pulse Rate 65     Resp 16     Temp 98.6 F (37 C)     Temp src      SpO2 100 %     Weight      Height      Head Circumference  Peak Flow      Pain Score      Pain Loc      Pain Edu?      Excl. in [place]?    No data found.  Updated Vital Signs BP 133/77   Pulse 65   Temp 98.6 F (37 C)   Resp 16   LMP 10/30/2010   SpO2 100%   Visual Acuity Right Eye Distance:   Left Eye Distance:   Bilateral Distance:    Right Eye Near:   Left Eye Near:    Bilateral Near:     Physical Exam  Constitutional: She appears well-developed and well-nourished.  Very pleasant. Non toxic or ill appearing.     HENT:  Head: Normocephalic and atraumatic.  Eyes: Conjunctivae are normal.  Neck: Normal range of motion.  Pulmonary/Chest: Effort normal.  Abdominal: Soft. Bowel sounds are normal.  Abdomen soft, non tender. No CVA tenderness. No rebound tenderness.     Genitourinary: Vagina normal. Cervix exhibits no motion tenderness, no discharge and no friability. Right adnexum displays no mass, no tenderness and no fullness. Left adnexum  displays no mass, no tenderness and no fullness. No erythema, tenderness or bleeding in the vagina. No foreign body in the vagina. No signs of injury around the vagina. No vaginal discharge found.  Genitourinary Comments: Pelvic exam did not reveal any abnormalities  Musculoskeletal: She exhibits tenderness. She exhibits no edema or deformity.  Mildly tender over right hip and groin area  Neurological: She is alert.  Skin: Skin is warm and dry.  Psychiatric: She has a normal mood and affect.  Nursing note and vitals reviewed.    UC Treatments / Results  Labs (all labs ordered are listed, but only abnormal results are displayed) Labs Reviewed  POCT URINALYSIS DIP (DEVICE) - Abnormal; Notable for the following components:      Result Value   Hgb urine dipstick TRACE (*)    All other components within normal limits  URINE CULTURE  CERVICOVAGINAL ANCILLARY ONLY    EKG None  Radiology No results found.  Procedures Procedures (including critical care time)  Medications Ordered in UC Medications - No data to display  Initial Impression / Assessment and Plan / UC Course  I have reviewed the triage vital signs and the nursing notes.  Pertinent labs & imaging results that were available during my care of the patient were reviewed by me and considered in my medical decision making (see chart for details).  Clinical Course as of Jun 10 1554  Wed Jun 10, 2018  1534 pH: 5.5 [TB]    Clinical Course User Index [TB] Loura Halt A, NP    Most likely muscle strain. Pelvic exam normal Urine showed trace blood Could be from vaginal irritation or recent sexual intercourse. No discharge noted or odor. We will send self swab for testing and urine for culture Naproxen for pain and inflammation with gentle stretching Final Clinical Impressions(s) / UC Diagnoses   Final diagnoses:  Muscle strain     Discharge Instructions     It was nice meeting you!!  Your urine was negative  for infection Your pelvic exam was normal Most likely muscle strain We will send a self swab to check for infection Follow up as needed for continued or worsening symptoms     ED Prescriptions    Medication Sig Dispense Auth. Provider   naproxen (NAPROSYN) 500 MG tablet Take 1 tablet (500 mg total) by mouth 2 (two) times daily.  30 tablet Loura Halt A, NP     Controlled Substance Prescriptions [place] Controlled Substance Registry consulted? Not Applicable   Orvan July, NP 06/10/18 1556

## 2018-06-10 NOTE — Discharge Instructions (Signed)
It was nice meeting you!!  Your urine was negative for infection Your pelvic exam was normal Most likely muscle strain We will send a self swab to check for infection Follow up as needed for continued or worsening symptoms

## 2018-06-10 NOTE — ED Triage Notes (Signed)
Pt states she has some R groin pain for a few days, saw her PCP and was given "some antibiotic" for a yeast infection. Pt states she does not remember what the medicine is. Pt states she still "feels itchy down there". Pt pointing to her R groin area. Denies tenderness.

## 2018-06-12 LAB — CERVICOVAGINAL ANCILLARY ONLY
BACTERIAL VAGINITIS: NEGATIVE
Candida vaginitis: NEGATIVE
Chlamydia: NEGATIVE
NEISSERIA GONORRHEA: NEGATIVE
Trichomonas: NEGATIVE

## 2018-06-12 LAB — URINE CULTURE: Culture: <10000 — AB

## 2018-07-09 ENCOUNTER — Other Ambulatory Visit: Payer: Self-pay | Admitting: Internal Medicine

## 2018-07-09 DIAGNOSIS — Z1231 Encounter for screening mammogram for malignant neoplasm of breast: Secondary | ICD-10-CM

## 2018-08-18 ENCOUNTER — Ambulatory Visit: Payer: Medicare HMO

## 2018-08-19 ENCOUNTER — Ambulatory Visit
Admission: RE | Admit: 2018-08-19 | Discharge: 2018-08-19 | Disposition: A | Discharge status: Home / Self Care | Payer: Medicare HMO | Source: Ambulatory Visit | Attending: Internal Medicine | Admitting: Internal Medicine

## 2018-08-19 DIAGNOSIS — Z1231 Encounter for screening mammogram for malignant neoplasm of breast: Secondary | ICD-10-CM

## 2019-09-12 ENCOUNTER — Encounter (HOSPITAL_COMMUNITY): Payer: Self-pay

## 2019-09-12 ENCOUNTER — Other Ambulatory Visit: Payer: Self-pay

## 2019-09-12 ENCOUNTER — Emergency Department (HOSPITAL_COMMUNITY)
Admission: EM | Admit: 2019-09-12 | Discharge: 2019-09-12 | Disposition: A | Discharge status: Home / Self Care | Payer: Medicare HMO | Attending: Emergency Medicine | Admitting: Emergency Medicine

## 2019-09-12 DIAGNOSIS — I10 Essential (primary) hypertension: Secondary | ICD-10-CM | POA: Diagnosis not present

## 2019-09-12 DIAGNOSIS — H5789 Other specified disorders of eye and adnexa: Secondary | ICD-10-CM

## 2019-09-12 DIAGNOSIS — S0501XA Injury of conjunctiva and corneal abrasion without foreign body, right eye, initial encounter: Secondary | ICD-10-CM | POA: Diagnosis not present

## 2019-09-12 DIAGNOSIS — Z79899 Other long term (current) drug therapy: Secondary | ICD-10-CM | POA: Insufficient documentation

## 2019-09-12 DIAGNOSIS — Y999 Unspecified external cause status: Secondary | ICD-10-CM | POA: Diagnosis not present

## 2019-09-12 DIAGNOSIS — Y939 Activity, unspecified: Secondary | ICD-10-CM | POA: Diagnosis not present

## 2019-09-12 DIAGNOSIS — Y929 Unspecified place or not applicable: Secondary | ICD-10-CM | POA: Insufficient documentation

## 2019-09-12 DIAGNOSIS — X58XXXA Exposure to other specified factors, initial encounter: Secondary | ICD-10-CM | POA: Insufficient documentation

## 2019-09-12 MED ORDER — ERYTHROMYCIN 5 MG/GM OP OINT
TOPICAL_OINTMENT | Freq: Once | OPHTHALMIC | Status: DC
  Filled 2019-09-12: qty 3.5

## 2019-09-12 MED ORDER — TETRACAINE HCL 0.5 % OP SOLN
2.0000 [drp] | Freq: Once | OPHTHALMIC | Status: AC
  Administered 2019-09-12: 10:00:00 2 [drp] via OPHTHALMIC
  Filled 2019-09-12: qty 4

## 2019-09-12 MED ORDER — FLUORESCEIN SODIUM 1 MG OP STRP
1.0000 | ORAL_STRIP | Freq: Once | OPHTHALMIC | Status: AC
  Administered 2019-09-12: 11:00:00 1 via OPHTHALMIC
  Filled 2019-09-12: qty 1

## 2019-09-12 NOTE — ED Provider Notes (Signed)
[facility] DEPT Provider Note   CSN: [id number] Arrival date & time: 09/12/19  [id number]     History Chief Complaint  Patient presents with  . Eye Problem    Diana Mack is a 58 y.o. female.  Diana Mack is a 58 y.o. female with a history of PE, hypertension, who presents to the ED for evaluation of bilateral eye redness with blurred vision in her right eye and discomfort.  She states symptoms started 3 days ago and were initially mild but her eyes have become increasingly red, this is much worse on the right than the left.  This morning she started experiencing some blurred vision in her right eye and became very concerned and presented to the ED for evaluation.  Patient states she was afraid she would be able to drive due to blurred vision so called EMS.  She denies any swelling around the eye, reports her eyes are sensitive to light but she denies pain with extraocular movements..  Anything is gotten into her eyes but states they have felt increasingly irritated and uncomfortable.  She denies eye discharge but notes some increasing wateriness and tearing.  States that her home is currently having the drywall removed due to increased moisture in her house, and there has been lots of dust and particles floating and coming out of the vent system and she wonders if this could be contributing or if she could have gotten mold in her eyes.  Denies any shortness of breath or difficulty breathing, no cough or nasal congestion.  The history is provided by the patient.       Past Medical History:  Diagnosis Date  . History of blood transfusion    surgery scar tissue abdominal was on Coumadin and was snicked during surgery  . Hypertension     Patient Active Problem List   Diagnosis Date Noted  . Pulmonary embolism ([place]) 01/01/2012  . Hypertension 01/01/2012    Past Surgical History:  Procedure Laterality Date  . ABDOMINAL ADHESION SURGERY       OB  History   No obstetric history on file.     Family History  Problem Relation Age of Onset  . Heart disease Father   . Heart disease Paternal Grandfather   . Colon cancer Neg Hx   . Breast cancer Neg Hx     Social History   Tobacco Use  . Smoking status: Never Smoker  . Smokeless tobacco: Never Used  Substance Use Topics  . Alcohol use: No  . Drug use: No    Home Medications Prior to Admission medications   Medication Sig Start Date End Date Taking? Authorizing Provider  diclofenac (VOLTAREN) 50 MG EC tablet Take 1 tablet (50 mg total) by mouth 2 (two) times daily. 05/14/17   Ashley Murrain, NP  diphenhydrAMINE (BENADRYL) 25 MG tablet Take 1 tablet (25 mg total) by mouth every 6 (six) hours. 02/25/16   Dorie Rank, MD  EPINEPHrine 0.15 MG/0.15ML IJ injection Inject 0.15 mLs (0.15 mg total) into the muscle as needed for anaphylaxis. 11/28/16   Tegeler, Gwenyth Allegra, MD  hydrochlorothiazide (HYDRODIURIL) 25 MG tablet Take 25 mg by mouth daily.    [provider]  lovastatin (MEVACOR) 20 MG tablet Take 20 mg by mouth at bedtime.    [provider]  metoprolol tartrate (LOPRESSOR) 25 MG tablet Take 25 mg by mouth 2 (two) times daily. 02/15/16   [provider]  naproxen (NAPROSYN) 500 MG  tablet Take 1 tablet (500 mg total) by mouth 2 (two) times daily. 06/10/18   Loura Halt A, NP    Allergies    Cephalexin and Sulfa antibiotics  Review of Systems   Review of Systems  Constitutional: Negative for chills and fever.  HENT: Negative for congestion, rhinorrhea and sore throat.   Eyes: Positive for photophobia, pain, discharge, redness and visual disturbance.  Skin: Negative for color change and rash.    Physical Exam Updated Vital Signs BP 138/85 (BP Location: Right Arm)   Pulse 69   Temp 98.4 F (36.9 C) (Oral)   Resp 16   LMP 10/30/2010   SpO2 99%   Physical Exam Vitals and nursing note reviewed.  Constitutional:      General: She is not in  acute distress.    Appearance: Normal appearance. She is well-developed and normal weight. She is not ill-appearing or diaphoretic.  HENT:     Head: Normocephalic and atraumatic.  Eyes:     General:        Right eye: No discharge.        Left eye: No discharge.     Comments: Bilateral eyes with erythematous conjunctive a and scleral injection, worse on the right than left.  No purulent discharge noted. PERRLA, EOMI.  No consensual pain. Vision is described as blurry in the right eye but she is able to make out outlines of objects. Visual acuity: R 20/70 L 20/30 Bilat 20/30 Fluorescein staining of the right eye reveals a small scleral abrasion at the 5-6 o'clock position, small portion of the abrasion extends over the cornea Normal fluorescein staining of the left eye. Normal eye pH bilaterally  Pulmonary:     Effort: Pulmonary effort is normal. No respiratory distress.  Skin:    General: Skin is warm and dry.  Neurological:     Mental Status: She is alert and oriented to person, place, and time.     Coordination: Coordination normal.  Psychiatric:        Mood and Affect: Mood normal.        Behavior: Behavior normal.     ED Results / Procedures / Treatments   Labs (all labs ordered are listed, but only abnormal results are displayed) Labs Reviewed - No data to display  EKG None  Radiology No results found.  Procedures Procedures (including critical care time)  Medications Ordered in ED Medications  erythromycin ophthalmic ointment (0 application Both Eyes Hold 09/12/19 1057)  tetracaine (PONTOCAINE) 0.5 % ophthalmic solution 2 drop (2 drops Both Eyes Given 09/12/19 1029)  fluorescein ophthalmic strip 1 strip (1 strip Both Eyes Given 09/12/19 1030)    ED Course  I have reviewed the triage vital signs and the nursing notes.  Pertinent labs & imaging results that were available during my care of the patient were reviewed by me and considered in my medical decision  making (see chart for details).    MDM Rules/Calculators/A&P                      Corneal abrasion  Pt with corneal abrasion and bilateral eye irritation on PE.  Eye irrigated w NS, no evidence of FB.  Patient describes blurred vision in the right eye with slight decrease in visual acuity but this was improved after eye irrigation.  Pt is not a contact lens wearer.  Exam non-concerning for orbital cellulitis, hyphema, corneal ulcers. Patient will be discharged home with erythromycin.   Patient  understands to follow up with ophthalmology, & to return to ER if new symptoms develop including change in vision, purulent drainage, or entrapment.   Final Clinical Impression(s) / ED Diagnoses Final diagnoses:  Abrasion of right cornea, initial encounter  Eye irritation    Rx / DC Orders ED Discharge Orders    None       Janet Berlin 09/12/19 1243    Gareth Morgan, MD 09/13/19 1332
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
## 2019-09-12 NOTE — Discharge Instructions (Addendum)
You have a small corneal abrasion on the right eye and eye irritation likely related to drywall dust from your house.  Use antibiotic ointment as prescribed 4 times daily for the next 7 days.  You can also use artificial tears eyedrops in your eyes as needed for relief from irritation.  Please follow-up with Dr. Prudencio Burly with ophthalmology if your symptoms are not improving, return to the ED for significantly worsening pain, changes in vision, swelling around the eye, fevers or any other new or concerning symptoms.  Try and avoid the areas in your home where they are working on the drywall until this is completed to avoid further eye irritation.

## 2019-12-27 ENCOUNTER — Other Ambulatory Visit: Payer: Self-pay | Admitting: Internal Medicine

## 2019-12-27 ENCOUNTER — Other Ambulatory Visit: Payer: Self-pay

## 2019-12-27 ENCOUNTER — Ambulatory Visit
Admission: RE | Admit: 2019-12-27 | Discharge: 2019-12-27 | Disposition: A | Discharge status: Home / Self Care | Payer: Medicare HMO | Source: Ambulatory Visit | Attending: Internal Medicine | Admitting: Internal Medicine

## 2019-12-27 DIAGNOSIS — Z1231 Encounter for screening mammogram for malignant neoplasm of breast: Secondary | ICD-10-CM

## 2020-10-11 ENCOUNTER — Encounter: Payer: Self-pay | Admitting: Emergency Medicine

## 2020-10-11 ENCOUNTER — Other Ambulatory Visit: Payer: Self-pay

## 2020-10-11 ENCOUNTER — Ambulatory Visit
Admission: EM | Admit: 2020-10-11 | Discharge: 2020-10-11 | Disposition: A | Discharge status: Home / Self Care | Payer: Medicare HMO | Attending: Urgent Care | Admitting: Urgent Care

## 2020-10-11 DIAGNOSIS — R0982 Postnasal drip: Secondary | ICD-10-CM | POA: Diagnosis not present

## 2020-10-11 DIAGNOSIS — R070 Pain in throat: Secondary | ICD-10-CM

## 2020-10-11 DIAGNOSIS — R059 Cough, unspecified: Secondary | ICD-10-CM

## 2020-10-11 MED ORDER — FLUTICASONE PROPIONATE 50 MCG/ACT NA SUSP: 2.0000 | Freq: Every day | NASAL | 0 refills | Status: DC

## 2020-10-11 MED ORDER — CETIRIZINE HCL 10 MG PO TABS: 10.0000 mg | ORAL_TABLET | Freq: Every day | ORAL | 0 refills | Status: AC

## 2020-10-11 MED ORDER — BENZONATATE 100 MG PO CAPS: 100.0000 mg | ORAL_CAPSULE | Freq: Three times a day (TID) | ORAL | 0 refills | Status: DC | PRN

## 2020-10-11 MED ORDER — PROMETHAZINE-DM 6.25-15 MG/5ML PO SYRP: 5.0000 mL | ORAL_SOLUTION | Freq: Every evening | ORAL | 0 refills | Status: DC | PRN

## 2020-10-11 NOTE — ED Triage Notes (Signed)
Pt sts sore throat starting last night after getting teeth cleaned she felt she needed to "cough something up"; denies fever
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
## 2020-10-11 NOTE — Discharge Instructions (Signed)
We will notify you of your COVID-19 test results as they arrive and may take between 24 to 48 hours.  I encourage you to sign up for MyChart if you have not already done so as this can be the easiest way for us to communicate results to you online or through a phone app.  In the meantime, if you develop worsening symptoms including fever, chest pain, shortness of breath despite our current treatment plan then please report to the emergency room as this may be a sign of worsening status from possible COVID-19 infection.  Otherwise, we will manage this as a viral syndrome. For sore throat or cough try using a honey-based tea. Use 3 teaspoons of honey with juice squeezed from half lemon. Place shaved pieces of ginger into 1/2-1 cup of water and warm over stove top. Then mix the ingredients and repeat every 4 hours as needed. Please take Tylenol 500mg-650mg every 6 hours for aches and pains, fevers. Hydrate very well with at least 2 liters of water. Eat light meals such as soups to replenish electrolytes and soft fruits, veggies. Start an antihistamine like Zyrtec for postnasal drainage, sinus congestion. 

## 2020-10-12 LAB — NOVEL CORONAVIRUS, NAA: SARS-CoV-2, NAA: NOT DETECTED

## 2020-10-12 LAB — SARS-COV-2, NAA 2 DAY TAT

## 2020-10-18 ENCOUNTER — Other Ambulatory Visit: Payer: Self-pay | Admitting: Internal Medicine

## 2020-10-18 DIAGNOSIS — Z1231 Encounter for screening mammogram for malignant neoplasm of breast: Secondary | ICD-10-CM

## 2020-12-28 ENCOUNTER — Other Ambulatory Visit: Payer: Self-pay

## 2020-12-28 ENCOUNTER — Ambulatory Visit
Admission: RE | Admit: 2020-12-28 | Discharge: 2020-12-28 | Disposition: A | Discharge status: Home / Self Care | Payer: Medicare HMO | Source: Ambulatory Visit | Attending: Internal Medicine | Admitting: Internal Medicine

## 2020-12-28 DIAGNOSIS — Z1231 Encounter for screening mammogram for malignant neoplasm of breast: Secondary | ICD-10-CM

## 2021-03-10 ENCOUNTER — Other Ambulatory Visit: Payer: Self-pay

## 2021-03-10 ENCOUNTER — Emergency Department (HOSPITAL_COMMUNITY): Payer: Medicare HMO

## 2021-03-10 ENCOUNTER — Emergency Department (HOSPITAL_COMMUNITY)
Admission: EM | Admit: 2021-03-10 | Discharge: 2021-03-10 | Disposition: A | Discharge status: Home / Self Care | Payer: Medicare HMO | Attending: Emergency Medicine | Admitting: Emergency Medicine

## 2021-03-10 ENCOUNTER — Encounter (HOSPITAL_COMMUNITY): Payer: Self-pay | Admitting: Emergency Medicine

## 2021-03-10 DIAGNOSIS — M542 Cervicalgia: Secondary | ICD-10-CM | POA: Insufficient documentation

## 2021-03-10 DIAGNOSIS — M79601 Pain in right arm: Secondary | ICD-10-CM | POA: Diagnosis not present

## 2021-03-10 DIAGNOSIS — R42 Dizziness and giddiness: Secondary | ICD-10-CM | POA: Insufficient documentation

## 2021-03-10 DIAGNOSIS — M7918 Myalgia, other site: Secondary | ICD-10-CM

## 2021-03-10 DIAGNOSIS — Y92481 Parking lot as the place of occurrence of the external cause: Secondary | ICD-10-CM | POA: Diagnosis not present

## 2021-03-10 DIAGNOSIS — M791 Myalgia, unspecified site: Secondary | ICD-10-CM | POA: Diagnosis not present

## 2021-03-10 DIAGNOSIS — M25561 Pain in right knee: Secondary | ICD-10-CM | POA: Insufficient documentation

## 2021-03-10 DIAGNOSIS — R519 Headache, unspecified: Secondary | ICD-10-CM | POA: Diagnosis present

## 2021-03-10 DIAGNOSIS — Z79899 Other long term (current) drug therapy: Secondary | ICD-10-CM | POA: Insufficient documentation

## 2021-03-10 DIAGNOSIS — I10 Essential (primary) hypertension: Secondary | ICD-10-CM | POA: Insufficient documentation

## 2021-03-10 DIAGNOSIS — M79602 Pain in left arm: Secondary | ICD-10-CM | POA: Diagnosis not present

## 2021-03-10 DIAGNOSIS — M545 Low back pain, unspecified: Secondary | ICD-10-CM | POA: Insufficient documentation

## 2021-03-10 DIAGNOSIS — M25571 Pain in right ankle and joints of right foot: Secondary | ICD-10-CM | POA: Insufficient documentation

## 2021-03-10 MED ORDER — ETODOLAC 200 MG PO CAPS
200.0000 mg | ORAL_CAPSULE | Freq: Once | ORAL | Status: AC
  Administered 2021-03-10: 14:00:00 200 mg via ORAL
  Filled 2021-03-10: qty 1

## 2021-03-10 MED ORDER — NAPROXEN 500 MG PO TABS: 500.0000 mg | ORAL_TABLET | Freq: Two times a day (BID) | ORAL | 0 refills | Status: AC

## 2021-03-10 NOTE — Discharge Instructions (Addendum)
You may alternate taking Tylenol and Naproxen as needed for pain control. You may take Naproxen twice daily as directed on your discharge paperwork and you may take  [id number] mg of Tylenol every 6 hours. Do not exceed 4000 mg of Tylenol daily as this can lead to liver damage. Also, make sure to take Naproxen with meals as it can cause an upset stomach. Do not take other NSAIDs while taking Naproxen such as (Aleve, Ibuprofen, Aspirin, Celebrex, etc) and do not take more than the prescribed dose as this can lead to ulcers and bleeding in your GI tract. You may use warm and cold compresses to help with your symptoms.   Please follow up with your primary doctor within the next 7-10 days for re-evaluation and further treatment of your symptoms.   Please return to the ER sooner if you have any new or worsening symptoms.
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
## 2021-04-04 DIAGNOSIS — M542 Cervicalgia: Secondary | ICD-10-CM | POA: Insufficient documentation

## 2021-04-04 DIAGNOSIS — M545 Low back pain, unspecified: Secondary | ICD-10-CM | POA: Insufficient documentation

## 2021-04-04 DIAGNOSIS — M25571 Pain in right ankle and joints of right foot: Secondary | ICD-10-CM | POA: Insufficient documentation

## 2021-04-04 DIAGNOSIS — M25561 Pain in right knee: Secondary | ICD-10-CM | POA: Insufficient documentation

## 2021-04-04 DIAGNOSIS — M25521 Pain in right elbow: Secondary | ICD-10-CM | POA: Insufficient documentation

## 2021-12-13 ENCOUNTER — Other Ambulatory Visit: Payer: Self-pay | Admitting: Internal Medicine

## 2021-12-13 DIAGNOSIS — Z1231 Encounter for screening mammogram for malignant neoplasm of breast: Secondary | ICD-10-CM

## 2022-01-01 ENCOUNTER — Ambulatory Visit
Admission: RE | Admit: 2022-01-01 | Discharge: 2022-01-01 | Disposition: A | Discharge status: Home / Self Care | Payer: Medicare HMO | Source: Ambulatory Visit | Attending: Internal Medicine | Admitting: Internal Medicine

## 2022-01-01 DIAGNOSIS — Z1231 Encounter for screening mammogram for malignant neoplasm of breast: Secondary | ICD-10-CM

## 2022-01-02 ENCOUNTER — Other Ambulatory Visit: Payer: Self-pay | Admitting: Internal Medicine

## 2022-01-02 DIAGNOSIS — R928 Other abnormal and inconclusive findings on diagnostic imaging of breast: Secondary | ICD-10-CM

## 2022-01-12 ENCOUNTER — Ambulatory Visit
Admission: RE | Admit: 2022-01-12 | Discharge: 2022-01-12 | Disposition: A | Discharge status: Home / Self Care | Payer: Medicare HMO | Source: Ambulatory Visit | Attending: Internal Medicine | Admitting: Internal Medicine

## 2022-01-12 ENCOUNTER — Other Ambulatory Visit: Payer: Self-pay | Admitting: Internal Medicine

## 2022-01-12 DIAGNOSIS — R928 Other abnormal and inconclusive findings on diagnostic imaging of breast: Secondary | ICD-10-CM

## 2022-01-23 ENCOUNTER — Ambulatory Visit
Admission: RE | Admit: 2022-01-23 | Discharge: 2022-01-23 | Disposition: A | Discharge status: Home / Self Care | Payer: Medicare HMO | Source: Ambulatory Visit | Attending: Internal Medicine | Admitting: Internal Medicine

## 2022-01-23 DIAGNOSIS — R928 Other abnormal and inconclusive findings on diagnostic imaging of breast: Secondary | ICD-10-CM

## 2022-05-09 ENCOUNTER — Ambulatory Visit
Admission: EM | Admit: 2022-05-09 | Discharge: 2022-05-09 | Disposition: A | Discharge status: Home / Self Care | Payer: Medicare HMO | Attending: Family Medicine | Admitting: Family Medicine

## 2022-05-09 ENCOUNTER — Ambulatory Visit (INDEPENDENT_AMBULATORY_CARE_PROVIDER_SITE_OTHER): Payer: Medicare HMO

## 2022-05-09 DIAGNOSIS — M546 Pain in thoracic spine: Secondary | ICD-10-CM | POA: Diagnosis not present

## 2022-05-09 DIAGNOSIS — M549 Dorsalgia, unspecified: Secondary | ICD-10-CM | POA: Diagnosis not present

## 2022-05-09 DIAGNOSIS — M545 Low back pain, unspecified: Secondary | ICD-10-CM

## 2022-05-09 DIAGNOSIS — W19XXXA Unspecified fall, initial encounter: Secondary | ICD-10-CM

## 2022-05-09 MED ORDER — BACLOFEN 10 MG PO TABS: 10.0000 mg | ORAL_TABLET | Freq: Three times a day (TID) | ORAL | 0 refills | Status: DC | PRN

## 2022-05-09 NOTE — Discharge Instructions (Addendum)
Your x-rays did not show any broken bones.  There was some arthritis in your low back.  You can continue using your co as you need them, and after a day or 2 you can switch to plain Tylenol.  Baclofen 10 mg--1 tablet 3 times daily as needed for muscle spasms
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
## 2022-07-09 ENCOUNTER — Ambulatory Visit: Payer: Self-pay | Admitting: Obstetrics and Gynecology

## 2022-07-19 ENCOUNTER — Other Ambulatory Visit (HOSPITAL_COMMUNITY)
Admission: RE | Admit: 2022-07-19 | Discharge: 2022-07-19 | Disposition: A | Discharge status: Home / Self Care | Payer: Medicare HMO | Source: Ambulatory Visit | Attending: Obstetrics and Gynecology | Admitting: Obstetrics and Gynecology

## 2022-07-19 ENCOUNTER — Ambulatory Visit (INDEPENDENT_AMBULATORY_CARE_PROVIDER_SITE_OTHER): Payer: Medicare HMO | Admitting: Obstetrics and Gynecology

## 2022-07-19 ENCOUNTER — Encounter: Payer: Self-pay | Admitting: Obstetrics and Gynecology

## 2022-07-19 VITALS — BP 138/86 | HR 75 | Ht 61.42 in | Wt 200.0 lb

## 2022-07-19 DIAGNOSIS — N950 Postmenopausal bleeding: Secondary | ICD-10-CM | POA: Diagnosis present

## 2022-07-19 DIAGNOSIS — N852 Hypertrophy of uterus: Secondary | ICD-10-CM | POA: Insufficient documentation

## 2022-07-19 DIAGNOSIS — Z113 Encounter for screening for infections with a predominantly sexual mode of transmission: Secondary | ICD-10-CM | POA: Diagnosis not present

## 2022-07-19 DIAGNOSIS — D259 Leiomyoma of uterus, unspecified: Secondary | ICD-10-CM | POA: Insufficient documentation

## 2022-07-19 DIAGNOSIS — N912 Amenorrhea, unspecified: Secondary | ICD-10-CM | POA: Insufficient documentation

## 2022-07-19 DIAGNOSIS — E049 Nontoxic goiter, unspecified: Secondary | ICD-10-CM

## 2022-07-19 DIAGNOSIS — N882 Stricture and stenosis of cervix uteri: Secondary | ICD-10-CM

## 2022-07-19 NOTE — Progress Notes (Signed)
61 y.o. No obstetric history on file. Single Black or African American Not Hispanic or Latino female here for  post menopausal bleeding for just one day  in 9/23 after going to the gym. Bleed for one day, no further bleeding. She reports occasional menstrual like cramps in at the time of the bleeding and again in the last week. Sexually active, same partner x 5-6 years. No pain, no bleeding.     Patient's last menstrual period was 10/30/2010.          Sexually active: Yes.    The current method of family planning is post menopausal status.    Exercising: Yes.     Walking and going to the gym  Smoker:  no  Health Maintenance: Pap:  She thinks she had one last year.  History of abnormal Pap:  no MMG:  01/01/22 denisty C incomplete see epic BMD:   n/a Colonoscopy: 2018 follow up 5 years  TDaP:  05/14/17 Gardasil: na    reports that she has never smoked. She has been exposed to tobacco smoke. She has never used smokeless tobacco. She reports that she does not drink alcohol and does not use drugs.  Past Medical History:  Diagnosis Date   Abnormal uterine bleeding    Clotting disorder ([place])    Fibroid    History of blood transfusion    surgery scar tissue abdominal was on Coumadin and was snicked during surgery   Hypertension    STD (sexually transmitted disease)    HSV    Past Surgical History:  Procedure Laterality Date   ABDOMINAL ADHESION SURGERY     BACK SURGERY  2012   IC filter    Current Outpatient Medications  Medication Sig Dispense Refill   cetirizine (ZYRTEC ALLERGY) 10 MG tablet Take 1 tablet (10 mg total) by mouth daily. 30 tablet 0   EPINEPHrine 0.15 MG/0.15ML IJ injection Inject 0.15 mLs (0.15 mg total) into the muscle as needed for anaphylaxis. 2 Device 0   fluticasone (FLONASE) 50 MCG/ACT nasal spray Place 2 sprays into both nostrils daily. 16 g 0   hydrochlorothiazide (HYDRODIURIL) 25 MG tablet Take 25 mg by mouth daily.     metoprolol tartrate (LOPRESSOR)  25 MG tablet Take 25 mg by mouth 2 (two) times daily.  3   PREDNISONE PO 10 mg.     rosuvastatin (CRESTOR) 10 MG tablet Take by mouth.     No current facility-administered medications for this visit.    Family History  Problem Relation Age of Onset   Heart disease Father    Heart disease Paternal Grandfather    Colon cancer Neg Hx    Breast cancer Neg Hx     Review of Systems  Genitourinary:  Positive for vaginal bleeding.    Exam:   BP 138/86   Pulse 75   Ht 5' 1.42" (1.56 m)   Wt 200 lb (90.7 kg)   LMP 10/30/2010   SpO2 99%   BMI 37.28 kg/m   Weight change: '@WEIGHTCHANGE'$ @ Height:   Height: 5' 1.42" (156 cm)  Ht Readings from Last 3 Encounters:  07/19/22 5' 1.42" (1.56 m)  03/10/21 '5\' 2"'$  (1.575 m)  09/12/19 '5\' 2"'$  (1.575 m)    General appearance: alert, cooperative and appears stated age Head: Normocephalic, without obvious abnormality, atraumatic Neck: no adenopathy, supple, symmetrical, trachea midline and thyroid  slightly irregular, R>L Abdomen: soft, non-tender; non distended,  no masses,  no organomegaly Extremities: extremities normal, atraumatic, no cyanosis  or edema Skin: Skin color, texture, turgor normal. No rashes or lesions Lymph nodes: Cervical, supraclavicular nodes normal. No abnormal inguinal nodes palpated Neurologic: Grossly normal   Pelvic: External genitalia:  no lesions              Urethra:  normal appearing urethra with no masses, tenderness or lesions              Bartholins and Skenes: normal                 Vagina: normal appearing vagina with normal color and discharge, no lesions              Cervix: no lesions and stenotic , friable with pap               Bimanual Exam:  Uterus:   no masses or tenderness              Adnexa: no mass, fullness, tenderness               Rectovaginal: Confirms               Anus:  normal sphincter tone, no lesions  The risks of endometrial biopsy were reviewed and a consent was obtained.  A speculum  was placed in the vagina and the cervix was cleansed with betadine. A tenaculum was placed on the cervix and the cervix was dilated with the os finder. The uterine evacuator catheter was placed into the endometrial cavity. The uterus sounded to 6-7 cm. The endometrial biopsy was performed, taking care to get a representative sample, sampling 360 degrees of the uterine cavity. Minimal tissue was obtained. The tenaculum and speculum were removed. There were no complications.    Gae Dry chaperoned for the exam.   1. Postmenopausal bleeding - Surgical pathology( [facility]/ [facility]) - Cytology - PAP - US PELVIS TRANSVAGINAL NON-OB (TV ONLY); Future  2. Stricture and stenosis of cervix Needed to dilate her cervix  3. Screening examination for STD (sexually transmitted disease) - SURESWAB CT/NG/T. vaginalis  4. Thyroid enlarged U/S slightly asymmetric, suspect nodule  - US SOFT TISSUE HEAD & NECK (NON-THYROID); Future

## 2022-07-19 NOTE — Patient Instructions (Signed)

## 2022-07-20 LAB — SURESWAB CT/NG/T. VAGINALIS
C. trachomatis RNA, TMA: NOT DETECTED
N. gonorrhoeae RNA, TMA: NOT DETECTED
Trichomonas vaginalis RNA: NOT DETECTED

## 2022-07-24 ENCOUNTER — Ambulatory Visit
Admission: RE | Admit: 2022-07-24 | Discharge: 2022-07-24 | Disposition: A | Discharge status: Home / Self Care | Payer: Medicare HMO | Source: Ambulatory Visit | Attending: Obstetrics and Gynecology | Admitting: Obstetrics and Gynecology

## 2022-07-24 DIAGNOSIS — E049 Nontoxic goiter, unspecified: Secondary | ICD-10-CM

## 2022-07-24 LAB — CYTOLOGY - PAP
Adequacy: ABSENT
Diagnosis: NEGATIVE

## 2022-07-24 LAB — SURGICAL PATHOLOGY

## 2022-07-25 ENCOUNTER — Other Ambulatory Visit: Payer: Self-pay | Admitting: Obstetrics and Gynecology

## 2022-07-25 ENCOUNTER — Other Ambulatory Visit: Payer: Self-pay | Admitting: *Deleted

## 2022-07-25 DIAGNOSIS — E042 Nontoxic multinodular goiter: Secondary | ICD-10-CM | POA: Insufficient documentation

## 2022-07-25 DIAGNOSIS — N950 Postmenopausal bleeding: Secondary | ICD-10-CM

## 2022-07-25 DIAGNOSIS — E041 Nontoxic single thyroid nodule: Secondary | ICD-10-CM

## 2022-07-25 NOTE — Progress Notes (Signed)
Diana Mack scheduled on 08/14/22

## 2022-08-01 ENCOUNTER — Other Ambulatory Visit: Payer: Medicare HMO

## 2022-08-01 ENCOUNTER — Encounter: Payer: Self-pay | Admitting: Internal Medicine

## 2022-08-07 ENCOUNTER — Ambulatory Visit: Payer: Medicare HMO | Admitting: Obstetrics and Gynecology

## 2022-08-14 ENCOUNTER — Ambulatory Visit (INDEPENDENT_AMBULATORY_CARE_PROVIDER_SITE_OTHER): Payer: Medicare HMO | Admitting: Obstetrics and Gynecology

## 2022-08-14 ENCOUNTER — Ambulatory Visit (INDEPENDENT_AMBULATORY_CARE_PROVIDER_SITE_OTHER): Payer: Medicare HMO

## 2022-08-14 ENCOUNTER — Telehealth: Payer: Self-pay | Admitting: Obstetrics and Gynecology

## 2022-08-14 ENCOUNTER — Other Ambulatory Visit: Payer: Medicare HMO

## 2022-08-14 ENCOUNTER — Encounter: Payer: Self-pay | Admitting: Obstetrics and Gynecology

## 2022-08-14 VITALS — BP 122/82 | HR 59

## 2022-08-14 DIAGNOSIS — N840 Polyp of corpus uteri: Secondary | ICD-10-CM | POA: Diagnosis not present

## 2022-08-14 DIAGNOSIS — N950 Postmenopausal bleeding: Secondary | ICD-10-CM | POA: Diagnosis not present

## 2022-08-14 DIAGNOSIS — E041 Nontoxic single thyroid nodule: Secondary | ICD-10-CM

## 2022-08-14 DIAGNOSIS — N882 Stricture and stenosis of cervix uteri: Secondary | ICD-10-CM

## 2022-08-14 DIAGNOSIS — I10 Essential (primary) hypertension: Secondary | ICD-10-CM | POA: Diagnosis not present

## 2022-08-14 DIAGNOSIS — Z86718 Personal history of other venous thrombosis and embolism: Secondary | ICD-10-CM

## 2022-08-14 MED ORDER — MISOPROSTOL 200 MCG PO TABS: ORAL_TABLET | ORAL | 0 refills | Status: DC

## 2022-08-14 NOTE — Progress Notes (Signed)
GYNECOLOGY  VISIT   HPI: 61 y.o.   Single Black or African American Not Hispanic or Latino  female   G0P0000 with Patient's last menstrual period was 10/30/2010.   here for sonohysterogram due to PMB. EMB 07/19/2022-benign inactive endometrium with polyp like changes. Pap was negative with absent TZ. Sureswab was negative for GC/CT/Trich.  GYNECOLOGIC HISTORY: Patient's last menstrual period was 10/30/2010. Contraception: PM Menopausal hormone therapy: none        OB History     Gravida  0   Para  0   Term  0   Preterm  0   AB  0   Living  0      SAB  0   IAB  0   Ectopic  0   Multiple  0   Live Births  0              Patient Active Problem List   Diagnosis Date Noted   Multiple thyroid nodules 07/25/2022   Amenorrhea 07/19/2022   Enlarged uterus 07/19/2022   Uterine leiomyoma 07/19/2022   Lumbar pain 04/04/2021   Pain in joint of right ankle 04/04/2021   Pain in joint of right elbow 04/04/2021   Pain in joint of right knee 04/04/2021   Neck pain 04/04/2021   Pulmonary embolism ([place]) 01/01/2012   Hypertension 01/01/2012    Past Medical History:  Diagnosis Date   Abnormal uterine bleeding    Clotting disorder ([place])    Fibroid    History of blood transfusion    surgery scar tissue abdominal was on Coumadin and was snicked during surgery   Hypertension    STD (sexually transmitted disease)    HSV    Past Surgical History:  Procedure Laterality Date   ABDOMINAL ADHESION SURGERY     BACK SURGERY  2012   IC filter    Current Outpatient Medications  Medication Sig Dispense Refill   cetirizine (ZYRTEC ALLERGY) 10 MG tablet Take 1 tablet (10 mg total) by mouth daily. 30 tablet 0   EPINEPHrine 0.15 MG/0.15ML IJ injection Inject 0.15 mLs (0.15 mg total) into the muscle as needed for anaphylaxis. 2 Device 0   fluticasone (FLONASE) 50 MCG/ACT nasal spray Place 2 sprays into both nostrils daily. 16 g 0   hydrochlorothiazide (HYDRODIURIL) 25 MG tablet  Take 25 mg by mouth daily.     metoprolol tartrate (LOPRESSOR) 25 MG tablet Take 25 mg by mouth 2 (two) times daily.  3   Multiple Vitamins-Minerals (ONE-A-DAY WOMENS PO) Take by mouth.     PREDNISONE PO 10 mg.     rosuvastatin (CRESTOR) 10 MG tablet Take by mouth.     No current facility-administered medications for this visit.     ALLERGIES: Cephalexin and Sulfa antibiotics  Family History  Problem Relation Age of Onset   Heart disease Father    Heart disease Paternal Grandfather    Colon cancer Neg Hx    Breast cancer Neg Hx     Social History   Socioeconomic History   Marital status: Single    Spouse name: Not on file   Number of children: Not on file   Years of education: Not on file   Highest education level: Not on file  Occupational History   Not on file  Tobacco Use   Smoking status: Never    Passive exposure: Past   Smokeless tobacco: Never  Substance and Sexual Activity   Alcohol use: No    Comment: [place]  Drug use: No   Sexual activity: Yes    Birth control/protection: Post-menopausal  Other Topics Concern   Not on file  Social History Narrative   Not on file   Social Determinants of Health   Financial Resource Strain: Not on file  Food Insecurity: Not on file  Transportation Needs: Not on file  Physical Activity: Not on file  Stress: Not on file  Social Connections: Not on file  Intimate Partner Violence: Not on file    Review of Systems  All other systems reviewed and are negative.   PHYSICAL EXAMINATION:    BP 122/82   Pulse (!) 59   LMP 10/30/2010   SpO2 93%     General appearance: alert, cooperative and appears stated age Neck: no adenopathy, supple, symmetrical, trachea midline and thyroid  slightly irregular R>L Heart: regular rate and rhythm Lungs: CTAB Abdomen: soft, non-tender; bowel sounds normal; no masses,  no organomegaly Extremities: normal, atraumatic, no cyanosis Skin: normal color, texture and turgor, no rashes or  lesions Lymph: normal cervical supraclavicular and inguinal nodes Neurologic: grossly normal   Pelvic: External genitalia:  no lesions              Urethra:  normal appearing urethra with no masses, tenderness or lesions              Bartholins and Skenes: normal                 Vagina: normal appearing vagina with normal color and discharge, no lesions              Cervix: no lesions                Sonohysterogram The procedure and risks of the procedure were reviewed with the patient, consent form was signed. A speculum was placed in the vagina and the cervix was cleansed with hibiclens. A tenaculum was placed at 12 o'clock. The sonohysterogram catheter was inserted into the uterine cavity without difficulty. Saline was infused under direct observation with the ultrasound. At least one endometrial polyp was noted.The catheter was removed.   Chaperone was present for exam.  1. Postmenopausal bleeding Endometrial polyp on sonohysterogram Plan: hysteroscopy, dilation and curettage. Reviewed risks, including: bleeding, infection, uterine perforation, fluid overload, need for further sugery  2. Cervical stenosis (uterine cervix) - misoprostol (CYTOTEC) 200 MCG tablet; Place 2 tablets vaginally the night prior to surgery  Dispense: 2 tablet; Refill: 0  3. Endometrial polyp  4. Hypertension, unspecified type Managed by primary  5. History of DVT (deep vein thrombosis) H/O bilateral lower extremity DVT in 2012, and a h/o lupus anticoagulant  She was treated with coumadin, which was discontinued following a postoperative cardiac arrest, hemorrhagic shock and retroperitoneal hematoma in 2012. She had an IVC filter placed.

## 2022-08-14 NOTE — Telephone Encounter (Signed)
Surgery: CPT [id number] - Hysteroscopy/D&C/Myosure  Diagnosis: N95.0 Postmenopausal Bleeding, N84.0 Endometrial Polyp,   Location: [place]  Status: Outpatient  Time: 30 Minutes  Assistant: N/A  Urgency: First Available  Pre-Op Appointment: Completed  Post-Op Appointment(s): 2 Weeks,   Time Out Of Work: Day Of Surgery ONLY  Please ask for preop clearance from her primary

## 2022-08-14 NOTE — Telephone Encounter (Signed)
Spoke with patient. Reviewed surgery dates. Patient request to proceed with surgery on 08/26/22.  Advised patient I will forward to business office for return call. I will return call once surgery date and time confirmed. Patient verbalizes understanding and is agreeable.   Surgery request sent.   PCP Clearance requested. Faxed to Dr. Sheryle Hail at [phone].    Routing to [place] for benefits

## 2022-08-14 NOTE — Addendum Note (Signed)
Addended by: Lorine Bears on: 08/14/2022 05:01 PM   Modules accepted: Orders

## 2022-08-14 NOTE — H&P (View-Only) (Signed)
GYNECOLOGY  VISIT   HPI: 61 y.o.   Single Black or African American Not Hispanic or Latino  female   G0P0000 with Patient's last menstrual period was 10/30/2010.   here for sonohysterogram due to PMB. EMB 07/19/2022-benign inactive endometrium with polyp like changes. Pap was negative with absent TZ. Sureswab was negative for GC/CT/Trich.  GYNECOLOGIC HISTORY: Patient's last menstrual period was 10/30/2010. Contraception: PM Menopausal hormone therapy: none        OB History     Gravida  0   Para  0   Term  0   Preterm  0   AB  0   Living  0      SAB  0   IAB  0   Ectopic  0   Multiple  0   Live Births  0              Patient Active Problem List   Diagnosis Date Noted   Multiple thyroid nodules 07/25/2022   Amenorrhea 07/19/2022   Enlarged uterus 07/19/2022   Uterine leiomyoma 07/19/2022   Lumbar pain 04/04/2021   Pain in joint of right ankle 04/04/2021   Pain in joint of right elbow 04/04/2021   Pain in joint of right knee 04/04/2021   Neck pain 04/04/2021   Pulmonary embolism ([place]) 01/01/2012   Hypertension 01/01/2012    Past Medical History:  Diagnosis Date   Abnormal uterine bleeding    Clotting disorder ([place])    Fibroid    History of blood transfusion    surgery scar tissue abdominal was on Coumadin and was snicked during surgery   Hypertension    STD (sexually transmitted disease)    HSV    Past Surgical History:  Procedure Laterality Date   ABDOMINAL ADHESION SURGERY     BACK SURGERY  2012   IC filter    Current Outpatient Medications  Medication Sig Dispense Refill   cetirizine (ZYRTEC ALLERGY) 10 MG tablet Take 1 tablet (10 mg total) by mouth daily. 30 tablet 0   EPINEPHrine 0.15 MG/0.15ML IJ injection Inject 0.15 mLs (0.15 mg total) into the muscle as needed for anaphylaxis. 2 Device 0   fluticasone (FLONASE) 50 MCG/ACT nasal spray Place 2 sprays into both nostrils daily. 16 g 0   hydrochlorothiazide (HYDRODIURIL) 25 MG tablet  Take 25 mg by mouth daily.     metoprolol tartrate (LOPRESSOR) 25 MG tablet Take 25 mg by mouth 2 (two) times daily.  3   Multiple Vitamins-Minerals (ONE-A-DAY WOMENS PO) Take by mouth.     PREDNISONE PO 10 mg.     rosuvastatin (CRESTOR) 10 MG tablet Take by mouth.     No current facility-administered medications for this visit.     ALLERGIES: Cephalexin and Sulfa antibiotics  Family History  Problem Relation Age of Onset   Heart disease Father    Heart disease Paternal Grandfather    Colon cancer Neg Hx    Breast cancer Neg Hx     Social History   Socioeconomic History   Marital status: Single    Spouse name: Not on file   Number of children: Not on file   Years of education: Not on file   Highest education level: Not on file  Occupational History   Not on file  Tobacco Use   Smoking status: Never    Passive exposure: Past   Smokeless tobacco: Never  Substance and Sexual Activity   Alcohol use: No    Comment: [place]  Drug use: No   Sexual activity: Yes    Birth control/protection: Post-menopausal  Other Topics Concern   Not on file  Social History Narrative   Not on file   Social Determinants of Health   Financial Resource Strain: Not on file  Food Insecurity: Not on file  Transportation Needs: Not on file  Physical Activity: Not on file  Stress: Not on file  Social Connections: Not on file  Intimate Partner Violence: Not on file    Review of Systems  All other systems reviewed and are negative.   PHYSICAL EXAMINATION:    BP 122/82   Pulse (!) 59   LMP 10/30/2010   SpO2 93%     General appearance: alert, cooperative and appears stated age Neck: no adenopathy, supple, symmetrical, trachea midline and thyroid  slightly irregular R>L Heart: regular rate and rhythm Lungs: CTAB Abdomen: soft, non-tender; bowel sounds normal; no masses,  no organomegaly Extremities: normal, atraumatic, no cyanosis Skin: normal color, texture and turgor, no rashes or  lesions Lymph: normal cervical supraclavicular and inguinal nodes Neurologic: grossly normal   Pelvic: External genitalia:  no lesions              Urethra:  normal appearing urethra with no masses, tenderness or lesions              Bartholins and Skenes: normal                 Vagina: normal appearing vagina with normal color and discharge, no lesions              Cervix: no lesions                Sonohysterogram The procedure and risks of the procedure were reviewed with the patient, consent form was signed. A speculum was placed in the vagina and the cervix was cleansed with hibiclens. A tenaculum was placed at 12 o'clock. The sonohysterogram catheter was inserted into the uterine cavity without difficulty. Saline was infused under direct observation with the ultrasound. At least one endometrial polyp was noted.The catheter was removed.   Chaperone was present for exam.  1. Postmenopausal bleeding Endometrial polyp on sonohysterogram Plan: hysteroscopy, dilation and curettage. Reviewed risks, including: bleeding, infection, uterine perforation, fluid overload, need for further sugery  2. Cervical stenosis (uterine cervix) - misoprostol (CYTOTEC) 200 MCG tablet; Place 2 tablets vaginally the night prior to surgery  Dispense: 2 tablet; Refill: 0  3. Endometrial polyp  4. Hypertension, unspecified type Managed by primary  5. History of DVT (deep vein thrombosis) H/O bilateral lower extremity DVT in 2012, and a h/o lupus anticoagulant  She was treated with coumadin, which was discontinued following a postoperative cardiac arrest, hemorrhagic shock and retroperitoneal hematoma in 2012. She had an IVC filter placed.

## 2022-08-15 LAB — THYROID PANEL WITH TSH
Free Thyroxine Index: 2 (ref 1.4–3.8)
T3 Uptake: 28 % (ref 22–35)
T4, Total: 7.3 ug/dL (ref 5.1–11.9)
TSH: 1.8 mIU/L (ref 0.40–4.50)

## 2022-08-15 NOTE — Telephone Encounter (Signed)
Left message to call Samiksha Pellicano, RN at [facility], [phone].  

## 2022-08-19 ENCOUNTER — Other Ambulatory Visit: Payer: Self-pay | Admitting: Internal Medicine

## 2022-08-19 ENCOUNTER — Ambulatory Visit (AMBULATORY_SURGERY_CENTER): Payer: Medicare HMO | Admitting: *Deleted

## 2022-08-19 ENCOUNTER — Telehealth: Payer: Self-pay | Admitting: Internal Medicine

## 2022-08-19 VITALS — Ht 62.0 in | Wt 202.8 lb

## 2022-08-19 DIAGNOSIS — Z86010 Personal history of colonic polyps: Secondary | ICD-10-CM

## 2022-08-19 MED ORDER — NA SULFATE-K SULFATE-MG SULF 17.5-3.13-1.6 GM/177ML PO SOLN: 1.0000 | Freq: Once | ORAL | 0 refills | Status: AC

## 2022-08-19 MED ORDER — PEG 3350-KCL-NA BICARB-NACL 420 G PO SOLR: 4000.0000 mL | Freq: Once | ORAL | 0 refills | Status: AC

## 2022-08-19 NOTE — Telephone Encounter (Signed)
Spoke with pt. And person at pharmacy,pt.did not want to pay for the suprep with coupon and insurance would pay for the golytely per pharmacist,new rx sent in for golytely and new set of instructions sent to pt. Via mail pt. Is aware of instructions being sent out.

## 2022-08-19 NOTE — Progress Notes (Signed)

## 2022-08-19 NOTE — Telephone Encounter (Signed)
Inbound call from patient states insurance does not cover prep medication. Please advise.

## 2022-08-21 ENCOUNTER — Encounter (HOSPITAL_BASED_OUTPATIENT_CLINIC_OR_DEPARTMENT_OTHER): Payer: Self-pay | Admitting: Obstetrics and Gynecology

## 2022-08-21 NOTE — Progress Notes (Signed)
Spoke w/ via phone for pre-op interview--- patient  Lab needs dos----  [place], EKG             Lab results------ COVID test -----patient states asymptomatic no test needed Arrive at ------- 0830 on 08/26/2022 NPO after MN NO Solid Food.  Clear liquids from MN until--- 0700 on 08/26/2022 Med rec completed Medications to take morning of surgery ----- metoprolol Diabetic medication ----- HOLD metformin DOS Patient instructed no nail polish to be worn day of surgery Patient instructed to bring photo id and insurance card day of surgery Patient aware to have Driver (ride ) / caregiver    for 24 hours after surgery - Carron Curie (sister) [phone] Patient Special Instructions ----- take cytotec 2 tablets vaginally night prior to surgery per surgeon's instructions; pt has and verbalized understanding Pre-Op special Istructions ----- n/a Patient verbalized understanding of instructions that were given at this phone interview. Patient denies shortness of breath, chest pain, fever, cough at this phone interview.  - H/O bilateral lower extremity DVT in 2012, and a h/o lupus anticoagulant  She was treated with coumadin, which was discontinued following a postoperative cardiac arrest, hemorrhagic shock and retroperitoneal hematoma in 2012. She had an IVC filter placed. - Currently being followed by PCP Dr. Pattricia Boss. Surgery clearance requested from PCP by Dr. Talbert Nan per [place] note on 08/14/22.  - TC to Glorianne Manchester, surgery scheduler for Dr. Talbert Nan, left message requesting clearance be faxed to PAT @ [facility] prior to surgery  Lyndel Pleasure, RN

## 2022-08-22 NOTE — Telephone Encounter (Signed)
Spoke with Darlene at Dr. Ralene Cork office. Surgical clearance faxed 08/14/22, patient is scheduled for surgery 08/26/22. Fax number confirmed [phone]. Advised I will resend fax. Was advised Dr. Sheryle Hail is out of the office today, Carlyon Shadow will review with Dr. Sheryle Hail first thing [place] morning.   Spoke with patient. Spoke with patient. Surgery date request confirmed.  Advised surgery is scheduled for  Surgery instruction sheet and hospital brochure reviewed, questions answered.  Patient states she was seen by Dr. Sheryle Hail on 08/17/22 for surgical clearance, per patient "no concerns". Advised I will call once clearance received if any additional f/u needed.  Patient notified of benefits per [place].   Patient verbalizes understanding and is agreeable.    Call returned to Dr. Ralene Cork office, spoke with Carlyon Shadow, confirmed fax received.

## 2022-08-23 NOTE — Progress Notes (Signed)
Received pt's pcp medical clearance via fax from Dr Talbert Nan office. Placed in chart.

## 2022-08-23 NOTE — Telephone Encounter (Signed)
Surgical clearance received from PCP, Dr. Sheryle Hail. Copy faxed to [facility] Pre-op and original to Dr. Talbert Nan to review and sign, to be scanned in chart.   Routing to provider for final review. Patient is agreeable to disposition. Will close encounter.

## 2022-08-26 ENCOUNTER — Other Ambulatory Visit: Payer: Self-pay

## 2022-08-26 ENCOUNTER — Ambulatory Visit (HOSPITAL_BASED_OUTPATIENT_CLINIC_OR_DEPARTMENT_OTHER): Payer: Medicare HMO | Admitting: Anesthesiology

## 2022-08-26 ENCOUNTER — Ambulatory Visit (HOSPITAL_BASED_OUTPATIENT_CLINIC_OR_DEPARTMENT_OTHER)
Admission: RE | Admit: 2022-08-26 | Discharge: 2022-08-26 | Disposition: A | Discharge status: Home / Self Care | Payer: Medicare HMO | Attending: Obstetrics and Gynecology | Admitting: Obstetrics and Gynecology

## 2022-08-26 ENCOUNTER — Encounter (HOSPITAL_BASED_OUTPATIENT_CLINIC_OR_DEPARTMENT_OTHER): Payer: Self-pay | Admitting: Obstetrics and Gynecology

## 2022-08-26 ENCOUNTER — Encounter (HOSPITAL_BASED_OUTPATIENT_CLINIC_OR_DEPARTMENT_OTHER)
Admission: RE | Disposition: A | Discharge status: Home / Self Care | Payer: Self-pay | Source: Home / Self Care | Attending: Obstetrics and Gynecology

## 2022-08-26 DIAGNOSIS — N882 Stricture and stenosis of cervix uteri: Secondary | ICD-10-CM | POA: Diagnosis not present

## 2022-08-26 DIAGNOSIS — Z86718 Personal history of other venous thrombosis and embolism: Secondary | ICD-10-CM | POA: Diagnosis not present

## 2022-08-26 DIAGNOSIS — I10 Essential (primary) hypertension: Secondary | ICD-10-CM | POA: Diagnosis not present

## 2022-08-26 DIAGNOSIS — N950 Postmenopausal bleeding: Secondary | ICD-10-CM | POA: Insufficient documentation

## 2022-08-26 DIAGNOSIS — N840 Polyp of corpus uteri: Secondary | ICD-10-CM

## 2022-08-26 HISTORY — PX: DILATATION & CURETTAGE/HYSTEROSCOPY WITH MYOSURE: SHX6511

## 2022-08-26 LAB — POCT I-STAT, CHEM 8
BUN: 13 mg/dL (ref 6–20)
BUN: 13 mg/dL (ref 6–20)
Calcium, Ion: 1.21 mmol/L (ref 1.15–1.40)
Calcium, Ion: 1.05 mmol/L — ABNORMAL LOW (ref 1.15–1.40)
Chloride: 101 mmol/L (ref 98–111)
Chloride: 102 mmol/L (ref 98–111)
Creatinine, Ser: 1 mg/dL (ref 0.44–1.00)
Creatinine, Ser: 1 mg/dL (ref 0.44–1.00)
Glucose, Bld: 107 mg/dL — ABNORMAL HIGH (ref 70–99)
Glucose, Bld: 104 mg/dL — ABNORMAL HIGH (ref 70–99)
HCT: 47 % — ABNORMAL HIGH (ref 36.0–46.0)
HCT: 42 % (ref 36.0–46.0)
Hemoglobin: 16 g/dL — ABNORMAL HIGH (ref 12.0–15.0)
Hemoglobin: 14.3 g/dL (ref 12.0–15.0)
Potassium: 3.8 mmol/L (ref 3.5–5.1)
Potassium: 3.6 mmol/L (ref 3.5–5.1)
Sodium: 140 mmol/L (ref 135–145)
Sodium: 137 mmol/L (ref 135–145)
TCO2: 29 mmol/L (ref 22–32)
TCO2: 30 mmol/L (ref 22–32)

## 2022-08-26 SURGERY — DILATATION & CURETTAGE/HYSTEROSCOPY WITH MYOSURE
Anesthesia: General | Site: Uterus

## 2022-08-26 MED ORDER — LIDOCAINE HCL (PF) 2 % IJ SOLN
INTRAMUSCULAR | Status: AC
  Filled 2022-08-26: qty 5

## 2022-08-26 MED ORDER — ONDANSETRON HCL 4 MG/2ML IJ SOLN
INTRAMUSCULAR | Status: AC
  Filled 2022-08-26: qty 2

## 2022-08-26 MED ORDER — ACETAMINOPHEN 500 MG PO TABS
1000.0000 mg | ORAL_TABLET | ORAL | Status: AC
  Administered 2022-08-26: 1000 mg via ORAL

## 2022-08-26 MED ORDER — EPHEDRINE 5 MG/ML INJ
INTRAVENOUS | Status: AC
  Filled 2022-08-26: qty 5

## 2022-08-26 MED ORDER — HYDROMORPHONE HCL 1 MG/ML IJ SOLN: 0.2500 mg | INTRAMUSCULAR | Status: DC | PRN

## 2022-08-26 MED ORDER — DEXAMETHASONE SODIUM PHOSPHATE 10 MG/ML IJ SOLN
INTRAMUSCULAR | Status: AC
  Filled 2022-08-26: qty 1

## 2022-08-26 MED ORDER — ACETAMINOPHEN 500 MG PO TABS
ORAL_TABLET | ORAL | Status: AC
  Filled 2022-08-26: qty 2

## 2022-08-26 MED ORDER — SILVER NITRATE-POT NITRATE 75-25 % EX MISC
CUTANEOUS | Status: DC | PRN
  Administered 2022-08-26: 2

## 2022-08-26 MED ORDER — FENTANYL CITRATE (PF) 100 MCG/2ML IJ SOLN
INTRAMUSCULAR | Status: DC | PRN
  Administered 2022-08-26: 50 ug via INTRAVENOUS
  Administered 2022-08-26 (×2): 25 ug via INTRAVENOUS

## 2022-08-26 MED ORDER — MIDAZOLAM HCL 2 MG/2ML IJ SOLN
INTRAMUSCULAR | Status: AC
  Filled 2022-08-26: qty 2

## 2022-08-26 MED ORDER — PROPOFOL 10 MG/ML IV BOLUS
INTRAVENOUS | Status: DC | PRN
  Administered 2022-08-26: 20 mg via INTRAVENOUS
  Administered 2022-08-26: 170 mg via INTRAVENOUS
  Administered 2022-08-26: 30 mg via INTRAVENOUS

## 2022-08-26 MED ORDER — PROPOFOL 10 MG/ML IV BOLUS
INTRAVENOUS | Status: AC
  Filled 2022-08-26: qty 20

## 2022-08-26 MED ORDER — LACTATED RINGERS IV SOLN: INTRAVENOUS | Status: DC

## 2022-08-26 MED ORDER — FENTANYL CITRATE (PF) 100 MCG/2ML IJ SOLN
INTRAMUSCULAR | Status: AC
  Filled 2022-08-26: qty 2

## 2022-08-26 MED ORDER — LIDOCAINE 2% (20 MG/ML) 5 ML SYRINGE
INTRAMUSCULAR | Status: DC | PRN
  Administered 2022-08-26: 80 mg via INTRAVENOUS

## 2022-08-26 MED ORDER — DEXAMETHASONE SODIUM PHOSPHATE 4 MG/ML IJ SOLN
INTRAMUSCULAR | Status: DC | PRN
  Administered 2022-08-26: 5 mg via INTRAVENOUS

## 2022-08-26 MED ORDER — SODIUM CHLORIDE 0.9 % IV SOLN
INTRAVENOUS | Status: AC | PRN
  Administered 2022-08-26: 3000 mL

## 2022-08-26 MED ORDER — ONDANSETRON HCL 4 MG/2ML IJ SOLN
INTRAMUSCULAR | Status: DC | PRN
  Administered 2022-08-26: 4 mg via INTRAVENOUS

## 2022-08-26 MED ORDER — KETOROLAC TROMETHAMINE 30 MG/ML IJ SOLN
INTRAMUSCULAR | Status: AC
  Filled 2022-08-26: qty 1

## 2022-08-26 MED ORDER — MIDAZOLAM HCL 5 MG/5ML IJ SOLN
INTRAMUSCULAR | Status: DC | PRN
  Administered 2022-08-26: 2 mg via INTRAVENOUS

## 2022-08-26 MED ORDER — EPHEDRINE SULFATE (PRESSORS) 50 MG/ML IJ SOLN
INTRAMUSCULAR | Status: DC | PRN
  Administered 2022-08-26 (×2): 10 mg via INTRAVENOUS

## 2022-08-26 MED ORDER — KETOROLAC TROMETHAMINE 30 MG/ML IJ SOLN
INTRAMUSCULAR | Status: DC | PRN
  Administered 2022-08-26: 30 mg via INTRAVENOUS

## 2022-08-26 SURGICAL SUPPLY — 21 items
CATH ROBINSON RED A/P 16FR (CATHETERS) IMPLANT
DEVICE MYOSURE LITE (MISCELLANEOUS) IMPLANT
DEVICE MYOSURE REACH (MISCELLANEOUS) IMPLANT
DILATOR CANAL MILEX (MISCELLANEOUS) IMPLANT
DRSG TELFA 3X8 NADH STRL (GAUZE/BANDAGES/DRESSINGS) ×1 IMPLANT
GAUZE 4X4 16PLY ~~LOC~~+RFID DBL (SPONGE) ×2 IMPLANT
GLOVE BIO SURGEON STRL SZ 6.5 (GLOVE) ×1 IMPLANT
GOWN STRL REUS W/TWL LRG LVL3 (GOWN DISPOSABLE) ×1 IMPLANT
IV NS IRRIG 3000ML ARTHROMATIC (IV SOLUTION) ×1 IMPLANT
KIT PROCEDURE FLUENT (KITS) ×1 IMPLANT
KIT TURNOVER CYSTO (KITS) ×1 IMPLANT
MYOSURE XL FIBROID (MISCELLANEOUS)
PACK VAGINAL MINOR WOMEN LF (CUSTOM PROCEDURE TRAY) ×1 IMPLANT
PAD OB MATERNITY 4.3X12.25 (PERSONAL CARE ITEMS) ×1 IMPLANT
PAD PREP 24X48 CUFFED NSTRL (MISCELLANEOUS) ×1 IMPLANT
SEAL CERVICAL OMNI LOK (ABLATOR) IMPLANT
SEAL ROD LENS SCOPE MYOSURE (ABLATOR) ×1 IMPLANT
SYR 20ML LL LF (SYRINGE) IMPLANT
SYSTEM TISS REMOVAL MYOSURE XL (MISCELLANEOUS) IMPLANT
TOWEL OR 17X26 10 PK STRL BLUE (TOWEL DISPOSABLE) ×1 IMPLANT
WATER STERILE IRR 500ML POUR (IV SOLUTION) IMPLANT

## 2022-08-26 NOTE — Anesthesia Preprocedure Evaluation (Addendum)
Anesthesia Evaluation  Patient identified by MRN, date of birth, ID band Patient awake    Reviewed: Allergy & Precautions, NPO status , Patient's Chart, lab work & pertinent test results  Airway Mallampati: II       Dental   Pulmonary neg pulmonary ROS   breath sounds clear to auscultation       Cardiovascular hypertension,  Rhythm:Regular Rate:Normal     Neuro/Psych negative neurological ROS     GI/Hepatic negative GI ROS, Neg liver ROS,,,  Endo/Other  negative endocrine ROS    Renal/GU negative Renal ROS     Musculoskeletal   Abdominal   Peds  Hematology   Anesthesia Other Findings   Reproductive/Obstetrics                             Anesthesia Physical Anesthesia Plan  ASA: 2  Anesthesia Plan: General   Post-op Pain Management: Tylenol PO (pre-op)*   Induction: Intravenous  PONV Risk Score and Plan: 3 and Ondansetron, Dexamethasone, Midazolam and Treatment may vary due to age or medical condition  Airway Management Planned: LMA  Additional Equipment:   Intra-op Plan:   Post-operative Plan: Extubation in OR  Informed Consent: I have reviewed the patients History and Physical, chart, labs and discussed the procedure including the risks, benefits and alternatives for the proposed anesthesia with the patient or authorized representative who has indicated his/her understanding and acceptance.     Dental advisory given  Plan Discussed with: CRNA and Anesthesiologist  Anesthesia Plan Comments:        Anesthesia Quick Evaluation

## 2022-08-26 NOTE — Anesthesia Postprocedure Evaluation (Signed)
Anesthesia Post Note  Patient: Diana Mack  Procedure(s) Performed: [place] (Uterus)     Patient location during evaluation: PACU Anesthesia Type: General Level of consciousness: awake Pain management: pain level controlled Vital Signs Assessment: post-procedure vital signs reviewed and stable Respiratory status: spontaneous breathing Cardiovascular status: stable Postop Assessment: no apparent nausea or vomiting Anesthetic complications: no   No notable events documented.  Last Vitals:  Vitals:   08/26/22 1215 08/26/22 1241  BP: (!) 153/74 130/84  Pulse: 62 60  Resp: 14 16  Temp:  36.7 C  SpO2: 96% 96%    Last Pain:  Vitals:   08/26/22 1241  TempSrc: Oral  PainSc: 0-No pain                 Camaria Gerald

## 2022-08-26 NOTE — Anesthesia Procedure Notes (Signed)
Procedure Name: LMA Insertion Date/Time: 08/26/2022 10:47 AM  Performed by: Justice Rocher, CRNAPre-anesthesia Checklist: Patient identified, Emergency Drugs available, Suction available, Patient being monitored and Timeout performed Patient Re-evaluated:Patient Re-evaluated prior to induction Oxygen Delivery Method: Circle system utilized Preoxygenation: Pre-oxygenation with 100% oxygen Induction Type: IV induction Ventilation: Mask ventilation without difficulty LMA: LMA inserted LMA Size: 4.0 Number of attempts: 1 Airway Equipment and Method: Bite block Placement Confirmation: positive ETCO2, breath sounds checked- equal and bilateral and CO2 detector Tube secured with: Tape Dental Injury: Teeth and Oropharynx as per pre-operative assessment

## 2022-08-26 NOTE — Interval H&P Note (Signed)
History and Physical Interval Note:  08/26/2022 10:16 AM  Diana Mack  has presented today for surgery, with the diagnosis of postmenopasual bleeding, endometrial polyp.  The various methods of treatment have been discussed with the patient and family. After consideration of risks, benefits and other options for treatment, the patient has consented to  Procedure(s): [place] (N/A) as a surgical intervention.  The patient's history has been reviewed, patient examined, no change in status, stable for surgery.  I have reviewed the patient's chart and labs.  Questions were answered to the patient's satisfaction.     Salvadore Dom

## 2022-08-26 NOTE — Op Note (Signed)
Preoperative Diagnosis: postmenopausal bleeding, endometrial polyp  Postoperative Diagnosis: same  Procedure: Hysteroscopy, polypectomy, dilation and curettage  Surgeon: Dr Sumner Boast  Assistants: None  Anesthesia: General via LMA  EBL: 5 cc  Fluids: 1,400  Fluid deficit: 105  Urine output: not recorded  Indications for surgery: The patient is a 61 yo female, who presented with postmenopausal bleeding. Work up included a negative pap smear, negative cervical cultures, an endometrial biopsy with inactive endometrium with polyp like changes and a sonohysterogram with endometrial polyps. The risks of the surgery were reviewed with the patient and the consent form was signed prior to her surgery.  Findings: Overall thin appearing endometrium with a few small endometrial polyps. Normal tubal ostia bilaterally  Specimens: endometrial polyps, endometrial curettings   Procedure: The patient was taken to the operating room with an IV in place. She was placed in the dorsal lithotomy position and anesthesia was administered. She was prepped and draped in the usual sterile fashion for a vaginal procedure. She voided on the way to the OR. A weighted speculum was placed in the vagina and a single tooth tenaculum was placed on the anterior lip of the cervix. The cervix was dilated to a #7 hagar dilator. The uterus was sounded to 7 cm. The myosure hysteroscope was inserted into the uterine cavity. With continuous infusion of normal saline, the uterine cavity was visualized with the above findings. The myosure reach was used to resect the polyps. The myosure was then removed. The cavity was then curetted with the small sharp curette. The cavity had the characteristically gritty texture at the end of the procedure. The curette and the single tooth tenaculum were removed. Oozing from the tenaculum site was stopped with pressure. She had an atrophic appearing vagina and had some oozing from the left  vaginal side wall, this was treated with silver nitrate. The speculum was removed. The patients perineum was cleansed of betadine and she was taken out of the dorsal lithotomy position.  Upon awakening the LMA was removed and the patient was transferred to the recovery room in stable and awake condition.  The sponge and instrument count were correct. There were no complications.

## 2022-08-26 NOTE — Discharge Instructions (Signed)
  Post Anesthesia Home Care Instructions  Activity: Get plenty of rest for the remainder of the day. A responsible individual must stay with you for 24 hours following the procedure.  For the next 24 hours, DO NOT: -Drive a car -Paediatric nurse -Drink alcoholic beverages -Take any medication unless instructed by your physician -Make any legal decisions or sign important papers.  Meals: Start with liquid foods such as gelatin or soup. Progress to regular foods as tolerated. Avoid greasy, spicy, heavy foods. If nausea and/or vomiting occur, drink only clear liquids until the nausea and/or vomiting subsides. Call your physician if vomiting continues.  Special Instructions/Symptoms: Your throat may feel dry or sore from the anesthesia or the breathing tube placed in your throat during surgery. If this causes discomfort, gargle with warm salt water. The discomfort should disappear within 24 hours.  If you had a scopolamine patch placed behind your ear for the management of post- operative nausea and/or vomiting:  1. The medication in the patch is effective for 72 hours, after which it should be removed.  Wrap patch in a tissue and discard in the trash. Wash hands thoroughly with soap and water. 2. You may remove the patch earlier than 72 hours if you experience unpleasant side effects which may include dry mouth, dizziness or visual disturbances. 3. Avoid touching the patch. Wash your hands with soap and water after contact with the patch.    No acetaminophen/Tylenol until after 3 pm today if needed. No ibuprofen, Advil, Aleve, Motrin, ketorolac, meloxicam, naproxen, or other NSAIDS until after 5:15 pm today if needed.    D & C Home care Instructions:   Personal hygiene:  Used sanitary napkins for vaginal drainage not tampons. Shower or tub bathe the day after your procedure. No douching until bleeding stops. Always wipe from front to back after  Elimination.  Activity: Do not drive or  operate any equipment today. The effects of the anesthesia are still present and drowsiness may result. Rest today, not necessarily flat bed rest, just take it easy. You may resume your normal activity in one to 2 days.  Sexual activity: No intercourse for one week or as indicated by your physician  Diet: Eat a light diet as desired this evening. You may resume a regular diet tomorrow.  Return to work: One to 2 days.  General Expectations of your surgery: Vaginal bleeding should be no heavier than a normal period. Spotting may continue up to 10 days. Mild cramps may continue for a couple of days. You may have a regular period in 2-6 weeks.  Unexpected observations call your doctor if these occur: persistent or heavy bleeding. Severe abdominal cramping or pain. Elevation of temperature greater than 100F.  Call for an appointment in one week.

## 2022-08-26 NOTE — Transfer of Care (Signed)
Immediate Anesthesia Transfer of Care Note  Patient: Diana Mack  Procedure(s) Performed: Procedure(s) (LRB): DILATATION & CURETTAGE/HYSTEROSCOPY WITH MYOSURE (N/A)  Patient Location: PACU  Anesthesia Type: General  Level of Consciousness: awake, sedated, patient cooperative and responds to stimulation  Airway & Oxygen Therapy: Patient Spontanous Breathing and Patient connected to [place] oxygen  Post-op Assessment: Report given to PACU RN, Post -op Vital signs reviewed and stable and Patient moving all extremities  Post vital signs: Reviewed and stable  Complications: No apparent anesthesia complications

## 2022-08-27 ENCOUNTER — Encounter (HOSPITAL_BASED_OUTPATIENT_CLINIC_OR_DEPARTMENT_OTHER): Payer: Self-pay | Admitting: Obstetrics and Gynecology

## 2022-08-27 LAB — SURGICAL PATHOLOGY

## 2022-08-27 NOTE — Telephone Encounter (Signed)
Patient called requesting a call back to discuss her prep instructions. Please advise.

## 2022-08-27 NOTE — Telephone Encounter (Signed)
All questions answered

## 2022-09-04 ENCOUNTER — Encounter: Payer: Self-pay | Admitting: Obstetrics and Gynecology

## 2022-09-04 ENCOUNTER — Ambulatory Visit (INDEPENDENT_AMBULATORY_CARE_PROVIDER_SITE_OTHER): Payer: Medicare HMO | Admitting: Obstetrics and Gynecology

## 2022-09-04 VITALS — BP 110/62 | HR 78 | Wt 198.0 lb

## 2022-09-04 DIAGNOSIS — Z9889 Other specified postprocedural states: Secondary | ICD-10-CM

## 2022-09-04 DIAGNOSIS — N840 Polyp of corpus uteri: Secondary | ICD-10-CM

## 2022-09-04 NOTE — Progress Notes (Signed)
GYNECOLOGY  VISIT   HPI: 61 y.o.   Single Black or African American Not Hispanic or Latino  female   [id number] with Patient's last menstrual period was 06/12/2022.   here for a post op check, she is 1.5 weeks s/p hysteroscopy, polypectomy, D&C. She had a few small endometrial polyps and an otherwise very thin endometrium.  Pathology was benign.   GYNECOLOGIC HISTORY: Patient's last menstrual period was 06/12/2022. Contraception:PMP Menopausal hormone therapy: none        OB History     Gravida  0   Para  0   Term  0   Preterm  0   AB  0   Living  0      SAB  0   IAB  0   Ectopic  0   Multiple  0   Live Births  0              Patient Active Problem List   Diagnosis Date Noted   Multiple thyroid nodules 07/25/2022   Amenorrhea 07/19/2022   Enlarged uterus 07/19/2022   Uterine leiomyoma 07/19/2022   Lumbar pain 04/04/2021   Pain in joint of right ankle 04/04/2021   Pain in joint of right elbow 04/04/2021   Pain in joint of right knee 04/04/2021   Neck pain 04/04/2021   Pulmonary embolism ([place]) 01/01/2012   Hypertension 01/01/2012    Past Medical History:  Diagnosis Date   Abnormal uterine bleeding    Allergy    Cataract    BILATERAL   Clotting disorder ([place])    Fibroid    History of blood transfusion    surgery scar tissue abdominal was on Coumadin and was snicked during surgery   Hyperlipidemia    Hypertension    takes metoprolol and lisinopril   Pre-diabetes    takes metformin "qod"   STD (sexually transmitted disease)    HSV    Past Surgical History:  Procedure Laterality Date   ABDOMINAL ADHESION SURGERY  2012   required hospital admission d/t complications s/p surgery   DILATATION & CURETTAGE/HYSTEROSCOPY WITH MYOSURE N/A 08/26/2022   Procedure: DILATATION & CURETTAGE/HYSTEROSCOPY WITH MYOSURE;  Surgeon: Salvadore Dom, MD;  Location: [facility];  Service: Gynecology;  Laterality: N/A;   IVC FILTER INSERTION   2012   PERIPHERAL VASCULAR THROMBECTOMY Bilateral    IVC filter    Current Outpatient Medications  Medication Sig Dispense Refill   cetirizine (ZYRTEC ALLERGY) 10 MG tablet Take 1 tablet (10 mg total) by mouth daily. (Patient taking differently: Take 10 mg by mouth as needed.) 30 tablet 0   EPINEPHrine 0.15 MG/0.15ML IJ injection Inject 0.15 mLs (0.15 mg total) into the muscle as needed for anaphylaxis. 2 Device 0   hydrochlorothiazide (HYDRODIURIL) 25 MG tablet Take 25 mg by mouth daily.     HYDROcodone-acetaminophen (NORCO/VICODIN) 5-325 MG tablet Take 1 tablet by mouth 2 (two) times daily as needed.     lisinopril (ZESTRIL) 10 MG tablet Take 10 mg by mouth daily.     metFORMIN (GLUCOPHAGE) 500 MG tablet Take 500 mg by mouth daily.     metoprolol tartrate (LOPRESSOR) 25 MG tablet Take 25 mg by mouth daily.  3   Multiple Vitamins-Minerals (ONE-A-DAY WOMENS PO) Take by mouth.     PREDNISONE PO 10 mg.     rosuvastatin (CRESTOR) 10 MG tablet Take by mouth.     No current facility-administered medications for this visit.     ALLERGIES: Cephalexin  and Sulfa antibiotics  Family History  Problem Relation Age of Onset   Heart disease Father    Heart disease Paternal Grandfather    Colon cancer Neg Hx    Breast cancer Neg Hx    Colon polyps Neg Hx    Crohn's disease Neg Hx    Esophageal cancer Neg Hx    Rectal cancer Neg Hx    Stomach cancer Neg Hx    Ulcerative colitis Neg Hx     Social History   Socioeconomic History   Marital status: Single    Spouse name: Not on file   Number of children: Not on file   Years of education: Not on file   Highest education level: Not on file  Occupational History   Not on file  Tobacco Use   Smoking status: Never    Passive exposure: Past   Smokeless tobacco: Never  Vaping Use   Vaping Use: Never used  Substance and Sexual Activity   Alcohol use: No    Comment: OCC   Drug use: No   Sexual activity: Yes    Birth control/protection:  Post-menopausal, None  Other Topics Concern   Not on file  Social History Narrative   Not on file   Social Determinants of Health   Financial Resource Strain: Not on file  Food Insecurity: Not on file  Transportation Needs: Not on file  Physical Activity: Not on file  Stress: Not on file  Social Connections: Not on file  Intimate Partner Violence: Not on file    ROS  PHYSICAL EXAMINATION:    BP 110/62   Pulse 78   Wt 198 lb (89.8 kg)   LMP 06/12/2022 Comment: REMOVAL POLYP  SpO2 100%   BMI 36.21 kg/m     General appearance: alert, cooperative and appears stated age Abdomen: soft, non-tender; non distended, no masses,  no organomegaly  1. History of hysteroscopy Benign pathology Routine f/u

## 2022-09-04 NOTE — Progress Notes (Signed)
See Dr Jertson's note 

## 2022-09-12 ENCOUNTER — Encounter: Payer: Self-pay | Admitting: Internal Medicine

## 2022-09-18 ENCOUNTER — Encounter: Payer: Self-pay | Admitting: Internal Medicine

## 2022-09-18 ENCOUNTER — Ambulatory Visit (AMBULATORY_SURGERY_CENTER): Payer: Medicare HMO | Admitting: Internal Medicine

## 2022-09-18 VITALS — BP 123/77 | HR 94 | Temp 98.9°F | Resp 15 | Ht 62.0 in | Wt 202.8 lb

## 2022-09-18 DIAGNOSIS — Z09 Encounter for follow-up examination after completed treatment for conditions other than malignant neoplasm: Secondary | ICD-10-CM

## 2022-09-18 DIAGNOSIS — Z86010 Personal history of colonic polyps: Secondary | ICD-10-CM | POA: Diagnosis not present

## 2022-09-18 MED ORDER — SODIUM CHLORIDE 0.9 % IV SOLN: 500.0000 mL | INTRAVENOUS | Status: DC

## 2022-09-18 NOTE — Patient Instructions (Signed)
Discharge instructions given. Handout on Hemorrhoids. Resume previous medications. YOU HAD AN ENDOSCOPIC PROCEDURE TODAY AT [facility]:   Refer to the procedure report that was given to you for any specific questions about what was found during the examination.  If the procedure report does not answer your questions, please call your gastroenterologist to clarify.  If you requested that your care partner not be given the details of your procedure findings, then the procedure report has been included in a sealed envelope for you to review at your convenience later.  YOU SHOULD EXPECT: Some feelings of bloating in the abdomen. Passage of more gas than usual.  Walking can help get rid of the air that was put into your GI tract during the procedure and reduce the bloating. If you had a lower endoscopy (such as a colonoscopy or flexible sigmoidoscopy) you may notice spotting of blood in your stool or on the toilet paper. If you underwent a bowel prep for your procedure, you may not have a normal bowel movement for a few days.  Please Note:  You might notice some irritation and congestion in your nose or some drainage.  This is from the oxygen used during your procedure.  There is no need for concern and it should clear up in a day or so.  SYMPTOMS TO REPORT IMMEDIATELY:  Following lower endoscopy (colonoscopy or flexible sigmoidoscopy):  Excessive amounts of blood in the stool  Significant tenderness or worsening of abdominal pains  Swelling of the abdomen that is new, acute  Fever of 100F or higher   For urgent or emergent issues, a gastroenterologist can be reached at any hour by calling [phone]. Do not use MyChart messaging for urgent concerns.    DIET:  We do recommend a small meal at first, but then you may proceed to your regular diet.  Drink plenty of fluids but you should avoid alcoholic beverages for 24 hours.  ACTIVITY:  You should plan to take it easy for the  rest of today and you should NOT DRIVE or use heavy machinery until tomorrow (because of the sedation medicines used during the test).    FOLLOW UP: Our staff will call the number listed on your records the next business day following your procedure.  We will call around 7:15- 8:00 am to check on you and address any questions or concerns that you may have regarding the information given to you following your procedure. If we do not reach you, we will leave a message.     If any biopsies were taken you will be contacted by phone or by letter within the next 1-3 weeks.  Please call us at [phone] if you have not heard about the biopsies in 3 weeks.    SIGNATURES/CONFIDENTIALITY: You and/or your care partner have signed paperwork which will be entered into your electronic medical record.  These signatures attest to the fact that that the information above on your After Visit Summary has been reviewed and is understood.  Full responsibility of the confidentiality of this discharge information lies with you and/or your care-partner.

## 2022-09-18 NOTE — Progress Notes (Signed)
First IV attempt unsuccessful, patient unhappy stating hat her veins are fine and no problem exists with her with her.    Offered hot pack, patient declined, requested another nurse start IV.

## 2022-09-18 NOTE — Progress Notes (Signed)
Called to room to assist during endoscopic procedure.  Patient ID and intended procedure confirmed with present staff. Received instructions for my participation in the procedure from the performing physician.  

## 2022-09-18 NOTE — Progress Notes (Signed)
Patient reports no changes to health or medications since visit.

## 2022-09-18 NOTE — Progress Notes (Signed)
GASTROENTEROLOGY PROCEDURE H&P NOTE   Primary Care Physician: Antonietta Jewel, MD    Reason for Procedure:  History of tubular adenoma of the colon  Plan:    Surveillance colonoscopy  Patient is appropriate for endoscopic procedure(s) in the ambulatory ([facility]) setting.  The nature of the procedure, as well as the risks, benefits, and alternatives were carefully and thoroughly reviewed with the patient. Ample time for discussion and questions allowed. The patient understood, was satisfied, and agreed to proceed.     HPI: Diana Mack is a 62 y.o. female who presents for surveillance colonoscopy.  Medical history as below.  Tolerated the prep.  No recent chest pain or shortness of breath.  No abdominal pain today.  Past Medical History:  Diagnosis Date   Abnormal uterine bleeding    Allergy    Cataract    BILATERAL   Clotting disorder ([facility])    Fibroid    History of blood transfusion    surgery scar tissue abdominal was on Coumadin and was snicked during surgery   Hyperlipidemia    Hypertension    takes metoprolol and lisinopril   Pre-diabetes    takes metformin "qod"   STD (sexually transmitted disease)    HSV    Past Surgical History:  Procedure Laterality Date   ABDOMINAL ADHESION SURGERY  2012   required hospital admission d/t complications s/p surgery   DILATATION & CURETTAGE/HYSTEROSCOPY WITH MYOSURE N/A 08/26/2022   Procedure: DILATATION & CURETTAGE/HYSTEROSCOPY WITH MYOSURE;  Surgeon: Salvadore Dom, MD;  Location: [facility];  Service: Gynecology;  Laterality: N/A;   IVC FILTER INSERTION  2012   PERIPHERAL VASCULAR THROMBECTOMY Bilateral    IVC filter    Prior to Admission medications   Medication Sig Start Date End Date Taking? Authorizing Provider  hydrochlorothiazide (HYDRODIURIL) 25 MG tablet Take 25 mg by mouth daily.   Yes [provider]  lisinopril (ZESTRIL) 10 MG tablet Take 10 mg by mouth daily. 08/15/22  Yes  [provider]  metFORMIN (GLUCOPHAGE) 500 MG tablet Take 500 mg by mouth daily. 08/15/22  Yes [provider]  metoprolol tartrate (LOPRESSOR) 25 MG tablet Take 25 mg by mouth daily. 02/15/16  Yes [provider]  rosuvastatin (CRESTOR) 10 MG tablet Take by mouth.   Yes [provider]  cetirizine (ZYRTEC ALLERGY) 10 MG tablet Take 1 tablet (10 mg total) by mouth daily. Patient taking differently: Take 10 mg by mouth as needed. 10/11/20   Jaynee Eagles, PA-C  EPINEPHrine 0.15 MG/0.15ML IJ injection Inject 0.15 mLs (0.15 mg total) into the muscle as needed for anaphylaxis. 11/28/16   Tegeler, Gwenyth Allegra, MD  HYDROcodone-acetaminophen (NORCO/VICODIN) 5-325 MG tablet Take 1 tablet by mouth 2 (two) times daily as needed. 08/16/22   [provider]  Multiple Vitamins-Minerals (ONE-A-DAY WOMENS PO) Take by mouth. Patient not taking: Reported on 09/18/2022    [provider]  PREDNISONE PO 10 mg. Patient not taking: Reported on 09/18/2022    [provider]    Current Outpatient Medications  Medication Sig Dispense Refill   hydrochlorothiazide (HYDRODIURIL) 25 MG tablet Take 25 mg by mouth daily.     lisinopril (ZESTRIL) 10 MG tablet Take 10 mg by mouth daily.     metFORMIN (GLUCOPHAGE) 500 MG tablet Take 500 mg by mouth daily.     metoprolol tartrate (LOPRESSOR) 25 MG tablet Take 25 mg by mouth daily.  3   rosuvastatin (CRESTOR) 10 MG tablet Take by mouth.  cetirizine (ZYRTEC ALLERGY) 10 MG tablet Take 1 tablet (10 mg total) by mouth daily. (Patient taking differently: Take 10 mg by mouth as needed.) 30 tablet 0   EPINEPHrine 0.15 MG/0.15ML IJ injection Inject 0.15 mLs (0.15 mg total) into the muscle as needed for anaphylaxis. 2 Device 0   HYDROcodone-acetaminophen (NORCO/VICODIN) 5-325 MG tablet Take 1 tablet by mouth 2 (two) times daily as needed.     Multiple Vitamins-Minerals (ONE-A-DAY WOMENS PO) Take by mouth. (Patient not taking:  Reported on 09/18/2022)     PREDNISONE PO 10 mg. (Patient not taking: Reported on 09/18/2022)     Current Facility-Administered Medications  Medication Dose Route Frequency Provider Last Rate Last Admin   0.9 %  sodium chloride infusion  500 mL Intravenous Continuous Siri Buege, Lajuan Lines, MD        Allergies as of 09/18/2022 - Review Complete 09/18/2022  Allergen Reaction Noted   Cephalexin Nausea And Vomiting 01/01/2012   Sulfa antibiotics  05/14/2017    Family History  Problem Relation Age of Onset   Heart disease Father    Heart disease Paternal Grandfather    Colon cancer Neg Hx    Breast cancer Neg Hx    Colon polyps Neg Hx    Crohn's disease Neg Hx    Esophageal cancer Neg Hx    Rectal cancer Neg Hx    Stomach cancer Neg Hx    Ulcerative colitis Neg Hx     Social History   Socioeconomic History   Marital status: Single    Spouse name: Not on file   Number of children: Not on file   Years of education: Not on file   Highest education level: Not on file  Occupational History   Not on file  Tobacco Use   Smoking status: Never    Passive exposure: Past   Smokeless tobacco: Never  Vaping Use   Vaping Use: Never used  Substance and Sexual Activity   Alcohol use: No    Comment: OCC   Drug use: No   Sexual activity: Yes    Birth control/protection: Post-menopausal, None  Other Topics Concern   Not on file  Social History Narrative   Not on file   Social Determinants of Health   Financial Resource Strain: Not on file  Food Insecurity: Not on file  Transportation Needs: Not on file  Physical Activity: Not on file  Stress: Not on file  Social Connections: Not on file  Intimate Partner Violence: Not on file    Physical Exam: Vital signs in last 24 hours: '@BP'$  (!) 152/77   Pulse 87   Temp 98.9 F (37.2 C)   Ht '5\' 2"'$  (1.575 m)   Wt 202 lb 12.8 oz (92 kg)   LMP 06/12/2022 Comment: REMOVAL POLYP  SpO2 99%   BMI 37.09 kg/m  GEN: NAD EYE: Sclerae anicteric ENT:  MMM CV: Non-tachycardic Pulm: CTA b/l GI: Soft, NT/ND NEURO:  Alert & Oriented x 3   Zenovia Jarred, MD [facility]  09/18/2022 2:32 PM

## 2022-09-18 NOTE — Op Note (Signed)
[facility] Patient Name: Diana Mack Procedure Date: 09/18/2022 2:35 PM MRN: [id number] Endoscopist: Jerene Bears , MD, [id number] Age: 62 Referring MD:  Date of Birth: 1961-02-21 Gender: Female Account #: [id number] Procedure:                Colonoscopy Indications:              High risk colon cancer surveillance: Personal                            history of non-advanced adenoma, Last colonoscopy:                            February 2018 Medicines:                Monitored Anesthesia Care Procedure:                Pre-Anesthesia Assessment:                           - Prior to the procedure, a History and Physical                            was performed, and patient medications and                            allergies were reviewed. The patient's tolerance of                            previous anesthesia was also reviewed. The risks                            and benefits of the procedure and the sedation                            options and risks were discussed with the patient.                            All questions were answered, and informed consent                            was obtained. Prior Anticoagulants: The patient has                            taken no anticoagulant or antiplatelet agents. ASA                            Grade Assessment: III - A patient with severe                            systemic disease. After reviewing the risks and                            benefits, the patient was deemed in satisfactory  condition to undergo the procedure.                           After obtaining informed consent, the colonoscope                            was passed under direct vision. Throughout the                            procedure, the patient's blood pressure, pulse, and                            oxygen saturations were monitored continuously. The                            Olympus CF-HQ190L (Serial# [id number]) Colonoscope  was                            introduced through the anus and advanced to the                            cecum, identified by appendiceal orifice and                            ileocecal valve. The colonoscopy was performed                            without difficulty. The patient tolerated the                            procedure well. The quality of the bowel                            preparation was good. The ileocecal valve,                            appendiceal orifice, and rectum were photographed. Scope In: 2:49:55 PM Scope Out: 3:01:46 PM Scope Withdrawal Time: 0 hours 7 minutes 18 seconds  Total Procedure Duration: 0 hours 11 minutes 51 seconds  Findings:                 The digital rectal exam was normal.                           The colon (entire examined portion) appeared normal.                           Internal hemorrhoids were found during                            retroflexion. The hemorrhoids were small. Complications:            No immediate complications. Estimated Blood Loss:     Estimated blood loss: none. Impression:               - The entire examined colon is  normal.                           - Small internal hemorrhoids.                           - No specimens collected. Recommendation:           - Patient has a contact number available for                            emergencies. The signs and symptoms of potential                            delayed complications were discussed with the                            patient. Return to normal activities tomorrow.                            Written discharge instructions were provided to the                            patient.                           - Resume previous diet.                           - Continue present medications.                           - Repeat colonoscopy in 10 years for surveillance. Jerene Bears, MD 09/18/2022 3:04:09 PM This report has been signed electronically.

## 2022-09-18 NOTE — Progress Notes (Signed)
A and O x3. Report to RN. Tolerated MAC anesthesia well. 

## 2022-09-19 ENCOUNTER — Telehealth: Payer: Self-pay | Admitting: *Deleted

## 2022-09-19 NOTE — Telephone Encounter (Signed)
  Follow up Call-     09/18/2022    2:07 PM  Call back number  Post procedure Call Back phone  # [phone]  Permission to leave phone message Yes     Patient questions:  Do you have a fever, pain , or abdominal swelling? No. Pain Score  0 *  Have you tolerated food without any problems? Yes.    Have you been able to return to your normal activities? Yes.    Do you have any questions about your discharge instructions: Diet   No. Medications  No. Follow up visit  No.  Do you have questions or concerns about your Care? No.  Actions: * If pain score is 4 or above: No action needed, pain <4.

## 2022-11-27 ENCOUNTER — Other Ambulatory Visit: Payer: Self-pay | Admitting: Internal Medicine

## 2022-11-27 DIAGNOSIS — Z1231 Encounter for screening mammogram for malignant neoplasm of breast: Secondary | ICD-10-CM

## 2023-01-24 ENCOUNTER — Ambulatory Visit
Admission: RE | Admit: 2023-01-24 | Discharge: 2023-01-24 | Disposition: A | Discharge status: Home / Self Care | Payer: Medicare HMO | Source: Ambulatory Visit | Attending: Internal Medicine | Admitting: Internal Medicine

## 2023-01-24 DIAGNOSIS — Z1231 Encounter for screening mammogram for malignant neoplasm of breast: Secondary | ICD-10-CM

## 2023-05-10 ENCOUNTER — Ambulatory Visit
Admission: EM | Admit: 2023-05-10 | Discharge: 2023-05-10 | Disposition: A | Discharge status: Home / Self Care | Payer: Medicare HMO | Attending: Internal Medicine | Admitting: Internal Medicine

## 2023-05-10 DIAGNOSIS — T7840XA Allergy, unspecified, initial encounter: Secondary | ICD-10-CM | POA: Diagnosis not present

## 2023-05-10 DIAGNOSIS — W57XXXA Bitten or stung by nonvenomous insect and other nonvenomous arthropods, initial encounter: Secondary | ICD-10-CM

## 2023-05-10 MED ORDER — METHYLPREDNISOLONE ACETATE 80 MG/ML IJ SUSP
80.0000 mg | Freq: Once | INTRAMUSCULAR | Status: AC
  Administered 2023-05-10: 80 mg via INTRAMUSCULAR

## 2023-05-10 MED ORDER — TRIAMCINOLONE ACETONIDE 0.1 % EX CREA: 1.0000 | TOPICAL_CREAM | Freq: Two times a day (BID) | CUTANEOUS | 0 refills | Status: AC

## 2023-05-10 NOTE — ED Provider Notes (Addendum)
[facility]    CSN: [id number] Arrival date & time: 05/10/23  6213      History   Chief Complaint Chief Complaint  Patient presents with   Insect Bite    HPI Diana Mack is a 62 y.o. female.   Patient presents today after getting bitten by several "red ants" in her bed about 3 days ago.  Patient states that she left a bag of chocolate in her bed and went to sleep.  The next morning, she woke up and was being bitten by several red ants.  States that they are very itchy.  She has applied hydrocortisone cream with minimal improvement.  Denies feelings of throat closing or shortness of breath.     Past Medical History:  Diagnosis Date   Abnormal uterine bleeding    Allergy    Cataract    BILATERAL   Clotting disorder (HCC)    Fibroid    History of blood transfusion    surgery scar tissue abdominal was on Coumadin and was snicked during surgery   Hyperlipidemia    Hypertension    takes metoprolol and lisinopril   Pre-diabetes    takes metformin "qod"   STD (sexually transmitted disease)    HSV    Patient Active Problem List   Diagnosis Date Noted   Multiple thyroid nodules 07/25/2022   Amenorrhea 07/19/2022   Enlarged uterus 07/19/2022   Uterine leiomyoma 07/19/2022   Lumbar pain 04/04/2021   Pain in joint of right ankle 04/04/2021   Pain in joint of right elbow 04/04/2021   Pain in joint of right knee 04/04/2021   Neck pain 04/04/2021   Pulmonary embolism (HCC) 01/01/2012   Hypertension 01/01/2012    Past Surgical History:  Procedure Laterality Date   ABDOMINAL ADHESION SURGERY  2012   required hospital admission d/t complications s/p surgery   DILATATION & CURETTAGE/HYSTEROSCOPY WITH MYOSURE N/A 08/26/2022   Procedure: DILATATION & CURETTAGE/HYSTEROSCOPY WITH MYOSURE;  Surgeon: Romualdo Bolk, MD;  Location: [facility] [place];  Service: Gynecology;  Laterality: N/A;   IVC FILTER INSERTION  2012   PERIPHERAL VASCULAR  THROMBECTOMY Bilateral    IVC filter    OB History     Gravida  0   Para  0   Term  0   Preterm  0   AB  0   Living  0      SAB  0   IAB  0   Ectopic  0   Multiple  0   Live Births  0            Home Medications    Prior to Admission medications   Medication Sig Start Date End Date Taking? Authorizing Provider  triamcinolone cream (KENALOG) 0.1 % Apply 1 Application topically 2 (two) times daily. 05/10/23  Yes Wasif Simonich, Acie Fredrickson, FNP  cetirizine (ZYRTEC ALLERGY) 10 MG tablet Take 1 tablet (10 mg total) by mouth daily. Patient taking differently: Take 10 mg by mouth as needed. 10/11/20   Wallis Bamberg, PA-C  EPINEPHrine 0.15 MG/0.15ML IJ injection Inject 0.15 mLs (0.15 mg total) into the muscle as needed for anaphylaxis. 11/28/16   Tegeler, Canary Brim, MD  hydrochlorothiazide (HYDRODIURIL) 25 MG tablet Take 25 mg by mouth daily.    [provider]  HYDROcodone-acetaminophen (NORCO/VICODIN) 5-325 MG tablet Take 1 tablet by mouth 2 (two) times daily as needed. 08/16/22   [provider]  lisinopril (ZESTRIL) 10 MG tablet Take 10 mg  by mouth daily. 08/15/22   [provider]  metFORMIN (GLUCOPHAGE) 500 MG tablet Take 500 mg by mouth daily. 08/15/22   [provider]  metoprolol tartrate (LOPRESSOR) 25 MG tablet Take 25 mg by mouth daily. 02/15/16   [provider]  Multiple Vitamins-Minerals (ONE-A-DAY WOMENS PO) Take by mouth. Patient not taking: Reported on 09/18/2022    [provider]  PREDNISONE PO 10 mg. Patient not taking: Reported on 09/18/2022    [provider]  rosuvastatin (CRESTOR) 10 MG tablet Take by mouth.    [provider]    Family History Family History  Problem Relation Age of Onset   Heart disease Father    Heart disease Paternal Grandfather    Colon cancer Neg Hx    Breast cancer Neg Hx    Colon polyps Neg Hx    Crohn's disease Neg Hx    Esophageal cancer Neg Hx    Rectal  cancer Neg Hx    Stomach cancer Neg Hx    Ulcerative colitis Neg Hx     Social History Social History   Tobacco Use   Smoking status: Never    Passive exposure: Past   Smokeless tobacco: Never  Vaping Use   Vaping status: Never Used  Substance Use Topics   Alcohol use: No    Comment: OCC   Drug use: No     Allergies   Cephalexin and Sulfa antibiotics   Review of Systems Review of Systems Per HPI  Physical Exam Triage Vital Signs ED Triage Vitals  Encounter Vitals Group     BP 05/10/23 0847 130/77     Systolic BP Percentile --      Diastolic BP Percentile --      Pulse Rate 05/10/23 0847 83     Resp 05/10/23 0847 18     Temp 05/10/23 0847 98 F (36.7 C)     Temp Source 05/10/23 0847 Oral     SpO2 05/10/23 0847 97 %     Weight --      Height --      Head Circumference --      Peak Flow --      Pain Score 05/10/23 0845 9     Pain Loc --      Pain Education --      Exclude from Growth Chart --    No data found.  Updated Vital Signs BP 130/77 (BP Location: Left Arm)   Pulse 83   Temp 98 F (36.7 C) (Oral)   Resp 18   LMP 06/12/2022 Comment: REMOVAL POLYP  SpO2 97%   Visual Acuity Right Eye Distance:   Left Eye Distance:   Bilateral Distance:    Right Eye Near:   Left Eye Near:    Bilateral Near:     Physical Exam Constitutional:      General: She is not in acute distress.    Appearance: Normal appearance. She is not toxic-appearing or diaphoretic.  HENT:     Head: Normocephalic and atraumatic.  Eyes:     Extraocular Movements: Extraocular movements intact.     Conjunctiva/sclera: Conjunctivae normal.  Pulmonary:     Effort: Pulmonary effort is normal.  Skin:    Comments: Patient has scattered, singular, erythematous papular lesions throughout left upper arm, left buttock/hip area, left lateral side close to lateral breast.   Neurological:     General: No focal deficit present.     Mental Status: She is alert and  oriented to person,  place, and time. Mental status is at baseline.  Psychiatric:        Mood and Affect: Mood normal.        Behavior: Behavior normal.        Thought Content: Thought content normal.        Judgment: Judgment normal.      UC Treatments / Results  Labs (all labs ordered are listed, but only abnormal results are displayed) Labs Reviewed - No data to display  EKG   Radiology No results found.  Procedures Procedures (including critical care time)  Medications Ordered in UC Medications  methylPREDNISolone acetate (DEPO-MEDROL) injection 80 mg (80 mg Intramuscular Given 05/10/23 0914)    Initial Impression / Assessment and Plan / UC Course  I have reviewed the triage vital signs and the nursing notes.  Pertinent labs & imaging results that were available during my care of the patient were reviewed by me and considered in my medical decision making (see chart for details).     Patient has scattered ant bites versus insect bites.  Will prescribe triamcinolone to apply topically.  Patient also given IM steroid today in urgent care to help alleviate itching and localized allergic reaction.  No signs of anaphylaxis on exam.  Advised supportive care and following up if symptoms persist or worsen.  Patient verbalized understanding and was agreeable with plan. Final Clinical Impressions(s) / UC Diagnoses   Final diagnoses:  Allergic reaction, initial encounter     Discharge Instructions      You were given a steroid shot today in urgent care and prescribed a cream to apply topically.  Do not apply cream to breast.  Follow-up if any symptoms persist or worsen.    ED Prescriptions     Medication Sig Dispense Auth. Provider   triamcinolone cream (KENALOG) 0.1 % Apply 1 Application topically 2 (two) times daily. 30 g Gustavus Bryant, [place]      PDMP not reviewed this encounter.   Gustavus Bryant, [place] 05/10/23 0933    Gustavus Bryant, [place] 05/10/23 [phone]

## 2023-05-10 NOTE — Discharge Instructions (Signed)
You were given a steroid shot today in urgent care and prescribed a cream to apply topically.  Do not apply cream to breast.  Follow-up if any symptoms persist or worsen.

## 2023-05-10 NOTE — ED Triage Notes (Signed)
Patient states she noticed ants in her bed and noticed she had been bitten by them on her left arm and left buttocks. The patient states the areas on the arm are itchy.   Started: 3 days ago  Home interventions: cortisone

## 2023-07-30 ENCOUNTER — Encounter: Payer: Self-pay | Admitting: Obstetrics and Gynecology

## 2023-07-30 ENCOUNTER — Ambulatory Visit (INDEPENDENT_AMBULATORY_CARE_PROVIDER_SITE_OTHER): Payer: Medicare HMO | Admitting: Obstetrics and Gynecology

## 2023-07-30 VITALS — BP 122/76 | HR 61 | Ht 62.0 in | Wt 205.0 lb

## 2023-07-30 DIAGNOSIS — Z9189 Other specified personal risk factors, not elsewhere classified: Secondary | ICD-10-CM

## 2023-07-30 DIAGNOSIS — B009 Herpesviral infection, unspecified: Secondary | ICD-10-CM | POA: Diagnosis not present

## 2023-07-30 DIAGNOSIS — N898 Other specified noninflammatory disorders of vagina: Secondary | ICD-10-CM | POA: Diagnosis not present

## 2023-07-30 DIAGNOSIS — R3121 Asymptomatic microscopic hematuria: Secondary | ICD-10-CM | POA: Diagnosis not present

## 2023-07-30 DIAGNOSIS — Z01419 Encounter for gynecological examination (general) (routine) without abnormal findings: Secondary | ICD-10-CM | POA: Insufficient documentation

## 2023-07-30 LAB — WET PREP FOR TRICH, YEAST, CLUE

## 2023-07-30 NOTE — Assessment & Plan Note (Signed)
 Cervical cancer screening performed according to ASCCP guidelines. Encouraged annual mammogram screening Colonoscopy UTD DXA N/A Labs and immunizations with her primary Encouraged safe sexual practices as indicated Encouraged healthy lifestyle practices with diet and exercise For patients under 50-60yo, I recommend 1200mg  calcium daily and 600IU of vitamin D daily.

## 2023-07-30 NOTE — Patient Instructions (Signed)
For patients under 50-60yo, I recommend 1200mg  calcium daily and 600IU of vitamin D daily. For patients over 60yo, I recommend 1200mg  calcium daily and 800IU of vitamin D daily.  Health Maintenance, Female Adopting a healthy lifestyle and getting preventive care are important in promoting health and wellness. Ask your health care provider about: The right schedule for you to have regular tests and exams. Things you can do on your own to prevent diseases and keep yourself healthy. What should I know about diet, weight, and exercise? Eat a healthy diet  Eat a diet that includes plenty of vegetables, fruits, low-fat dairy products, and lean protein. Do not eat a lot of foods that are high in solid fats, added sugars, or sodium. Maintain a healthy weight Body mass index (BMI) is used to identify weight problems. It estimates body fat based on height and weight. Your health care provider can help determine your BMI and help you achieve or maintain a healthy weight. Get regular exercise Get regular exercise. This is one of the most important things you can do for your health. Most adults should: Exercise for at least 150 minutes each week. The exercise should increase your heart rate and make you sweat (moderate-intensity exercise). Do strengthening exercises at least twice a week. This is in addition to the moderate-intensity exercise. Spend less time sitting. Even light physical activity can be beneficial. Watch cholesterol and blood lipids Have your blood tested for lipids and cholesterol at 60 years of age, then have this test every 5 years. Have your cholesterol levels checked more often if: Your lipid or cholesterol levels are high. You are older than 60 years of age. You are at high risk for heart disease. What should I know about cancer screening? Depending on your health history and family history, you may need to have cancer screening at various ages. This may include screening  for: Breast cancer. Cervical cancer. Colorectal cancer. Skin cancer. Lung cancer. What should I know about heart disease, diabetes, and high blood pressure? Blood pressure and heart disease High blood pressure causes heart disease and increases the risk of stroke. This is more likely to develop in people who have high blood pressure readings or are overweight. Have your blood pressure checked: Every 3-5 years if you are 83-60 years of age. Every year if you are 4 years old or older. Diabetes Have regular diabetes screenings. This checks your fasting blood sugar level. Have the screening done: Once every three years after age 68 if you are at a normal weight and have a low risk for diabetes. More often and at a younger age if you are overweight or have a high risk for diabetes. What should I know about preventing infection? Hepatitis B If you have a higher risk for hepatitis B, you should be screened for this virus. Talk with your health care provider to find out if you are at risk for hepatitis B infection. Hepatitis C Testing is recommended for: Everyone born from [birth year] through 1963. Anyone with known risk factors for hepatitis C. Sexually transmitted infections (STIs) Get screened for STIs, including gonorrhea and chlamydia, if: You are sexually active and are younger than 60 years of age. You are older than 60 years of age and your health care provider tells you that you are at risk for this type of infection. Your sexual activity has changed since you were last screened, and you are at increased risk for chlamydia or gonorrhea. Ask your health care provider if  you are at risk. Ask your health care provider about whether you are at high risk for HIV. Your health care provider may recommend a prescription medicine to help prevent HIV infection. If you choose to take medicine to prevent HIV, you should first get tested for HIV. You should then be tested every 3 months for as long as you  are taking the medicine. Osteoporosis and menopause Osteoporosis is a disease in which the bones lose minerals and strength with aging. This can result in bone fractures. If you are 44 years old or older, or if you are at risk for osteoporosis and fractures, ask your health care provider if you should: Be screened for bone loss. Take a calcium or vitamin D supplement to lower your risk of fractures. Be given hormone replacement therapy (HRT) to treat symptoms of menopause. Follow these instructions at home: Alcohol use Do not drink alcohol if: Your health care provider tells you not to drink. You are pregnant, may be pregnant, or are planning to become pregnant. If you drink alcohol: Limit how much you have to: 0-1 drink a day. Know how much alcohol is in your drink. In the [place] one drink equals one 12 oz bottle of beer (355 mL), one 5 oz glass of wine (148 mL), or one 1 oz glass of hard liquor (44 mL). Lifestyle Do not use any products that contain nicotine or tobacco. These products include cigarettes, chewing tobacco, and vaping devices, such as e-cigarettes. If you need help quitting, ask your health care provider. Do not use street drugs. Do not share needles. Ask your health care provider for help if you need support or information about quitting drugs. General instructions Schedule regular health, dental, and eye exams. Stay current with your vaccines. Tell your health care provider if: You often feel depressed. You have ever been abused or do not feel safe at home. Summary Adopting a healthy lifestyle and getting preventive care are important in promoting health and wellness. Follow your health care provider's instructions about healthy diet, exercising, and getting tested or screened for diseases. Follow your health care provider's instructions on monitoring your cholesterol and blood pressure. This information is not intended to replace advice given to you by your health  care provider. Make sure you discuss any questions you have with your health care provider. Document Revised: 01/22/2021 Document Reviewed: 01/22/2021 Elsevier Patient Education  2024 [employer].
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
## 2023-08-01 LAB — URINE CULTURE
MICRO NUMBER:: 15725134
Result:: NO GROWTH
SPECIMEN QUALITY:: ADEQUATE

## 2023-08-01 LAB — URINALYSIS, COMPLETE W/RFL CULTURE
Bilirubin Urine: NEGATIVE
Glucose, UA: NEGATIVE
Hyaline Cast: NONE SEEN /[LPF]
Ketones, ur: NEGATIVE
Nitrites, Initial: NEGATIVE
Protein, ur: NEGATIVE
Specific Gravity, Urine: 1.02 (ref 1.001–1.035)
pH: 5.5 (ref 5.0–8.0)

## 2023-08-01 LAB — CULTURE INDICATED

## 2023-08-06 ENCOUNTER — Other Ambulatory Visit: Payer: Medicare HMO

## 2023-08-06 DIAGNOSIS — R3121 Asymptomatic microscopic hematuria: Secondary | ICD-10-CM

## 2023-08-06 LAB — URINALYSIS, COMPLETE W/RFL CULTURE
Bacteria, UA: NONE SEEN /[HPF]
Bilirubin Urine: NEGATIVE
Glucose, UA: NEGATIVE
Hyaline Cast: NONE SEEN /[LPF]
Ketones, ur: NEGATIVE
Leukocyte Esterase: NEGATIVE
Nitrites, Initial: NEGATIVE
Protein, ur: NEGATIVE
RBC / HPF: NONE SEEN /[HPF] (ref 0–2)
Specific Gravity, Urine: 1.025 (ref 1.001–1.035)
WBC, UA: NONE SEEN /[HPF] (ref 0–5)
pH: 5.5 (ref 5.0–8.0)

## 2023-08-06 LAB — NO CULTURE INDICATED

## 2023-09-11 ENCOUNTER — Ambulatory Visit: Payer: Medicare HMO | Admitting: Obstetrics and Gynecology

## 2023-10-07 ENCOUNTER — Other Ambulatory Visit: Payer: Self-pay | Admitting: Internal Medicine

## 2023-10-07 DIAGNOSIS — Z0000 Encounter for general adult medical examination without abnormal findings: Secondary | ICD-10-CM

## 2024-01-11 ENCOUNTER — Other Ambulatory Visit: Payer: Self-pay

## 2024-01-11 ENCOUNTER — Emergency Department (HOSPITAL_COMMUNITY)

## 2024-01-11 ENCOUNTER — Encounter (HOSPITAL_COMMUNITY): Payer: Self-pay

## 2024-01-11 ENCOUNTER — Emergency Department (HOSPITAL_COMMUNITY)
Admission: EM | Admit: 2024-01-11 | Discharge: 2024-01-11 | Disposition: A | Discharge status: Home / Self Care | Attending: Emergency Medicine | Admitting: Emergency Medicine

## 2024-01-11 DIAGNOSIS — I10 Essential (primary) hypertension: Secondary | ICD-10-CM | POA: Diagnosis not present

## 2024-01-11 DIAGNOSIS — M25511 Pain in right shoulder: Secondary | ICD-10-CM | POA: Insufficient documentation

## 2024-01-11 DIAGNOSIS — Y92410 Unspecified street and highway as the place of occurrence of the external cause: Secondary | ICD-10-CM | POA: Diagnosis not present

## 2024-01-11 DIAGNOSIS — Z79899 Other long term (current) drug therapy: Secondary | ICD-10-CM | POA: Diagnosis not present

## 2024-01-11 DIAGNOSIS — M542 Cervicalgia: Secondary | ICD-10-CM | POA: Diagnosis present

## 2024-01-11 DIAGNOSIS — S0990XA Unspecified injury of head, initial encounter: Secondary | ICD-10-CM | POA: Diagnosis present

## 2024-01-11 DIAGNOSIS — M546 Pain in thoracic spine: Secondary | ICD-10-CM | POA: Diagnosis not present

## 2024-01-11 MED ORDER — METHOCARBAMOL 500 MG PO TABS: 500.0000 mg | ORAL_TABLET | Freq: Two times a day (BID) | ORAL | 0 refills | Status: AC

## 2024-01-11 MED ORDER — IBUPROFEN 400 MG PO TABS
600.0000 mg | ORAL_TABLET | Freq: Once | ORAL | Status: AC
  Administered 2024-01-11: 600 mg via ORAL
  Filled 2024-01-11: qty 1

## 2024-01-11 MED ORDER — HYDROCODONE-ACETAMINOPHEN 5-325 MG PO TABS
1.0000 | ORAL_TABLET | Freq: Once | ORAL | Status: AC
  Administered 2024-01-11: 1 via ORAL
  Filled 2024-01-11: qty 1

## 2024-01-11 NOTE — ED Notes (Signed)
 Patient transported to CT
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
## 2024-01-26 ENCOUNTER — Ambulatory Visit: Payer: Medicare HMO

## 2024-01-26 ENCOUNTER — Ambulatory Visit
Admission: RE | Admit: 2024-01-26 | Discharge: 2024-01-26 | Disposition: A | Discharge status: Home / Self Care | Source: Ambulatory Visit | Attending: Internal Medicine | Admitting: Internal Medicine

## 2024-01-26 DIAGNOSIS — Z0000 Encounter for general adult medical examination without abnormal findings: Secondary | ICD-10-CM

## 2024-07-30 ENCOUNTER — Encounter (HOSPITAL_COMMUNITY): Payer: Self-pay | Admitting: Vascular Surgery

## 2024-08-03 ENCOUNTER — Other Ambulatory Visit (HOSPITAL_COMMUNITY): Payer: Self-pay | Admitting: Nurse Practitioner

## 2024-08-03 DIAGNOSIS — Z95828 Presence of other vascular implants and grafts: Secondary | ICD-10-CM

## 2024-08-03 DIAGNOSIS — Z4589 Encounter for adjustment and management of other implanted devices: Secondary | ICD-10-CM

## 2024-08-04 ENCOUNTER — Ambulatory Visit (HOSPITAL_COMMUNITY)
Admission: RE | Admit: 2024-08-04 | Discharge: 2024-08-04 | Disposition: A | Discharge status: Home / Self Care | Source: Ambulatory Visit | Attending: Nurse Practitioner | Admitting: Nurse Practitioner

## 2024-08-04 DIAGNOSIS — Z4589 Encounter for adjustment and management of other implanted devices: Secondary | ICD-10-CM | POA: Diagnosis present

## 2024-08-04 DIAGNOSIS — I10 Essential (primary) hypertension: Secondary | ICD-10-CM | POA: Diagnosis present

## 2024-08-04 DIAGNOSIS — Z95828 Presence of other vascular implants and grafts: Secondary | ICD-10-CM | POA: Diagnosis present

## 2024-08-04 LAB — POCT I-STAT CREATININE: Creatinine, Ser: 1.1 mg/dL — ABNORMAL HIGH (ref 0.44–1.00)

## 2024-08-04 MED ORDER — IOHEXOL 350 MG/ML SOLN
100.0000 mL | Freq: Once | INTRAVENOUS | Status: AC | PRN
  Administered 2024-08-04: 100 mL via INTRAVENOUS

## 2024-08-04 MED ORDER — SODIUM CHLORIDE (PF) 0.9 % IJ SOLN
INTRAMUSCULAR | Status: AC
  Filled 2024-08-04: qty 50

## 2024-08-04 MED ORDER — SODIUM CHLORIDE (PF) 0.9 % IJ SOLN
INTRAMUSCULAR | Status: AC
Start: 2024-08-04 — End: 2024-08-04
  Filled 2024-08-04: qty 50

## 2024-08-04 MED ORDER — IOHEXOL 300 MG/ML  SOLN: 100.0000 mL | Freq: Once | INTRAMUSCULAR | Status: DC | PRN

## 2024-08-18 NOTE — Progress Notes (Unsigned)
 Office Note     CC: Evaluation of 2012 IVC filter Requesting Provider:  Luvenia Badder, NP  HPI: Diana Mack is a 63 y.o. ([birth year]) female presenting at the request of .Norval Kettle, MD for evaluation of IVC filter placed in 2012.    The pt is *** on a statin for cholesterol management.  The pt is *** on a daily aspirin.   Other AC:  *** The pt is *** on medication for hypertension.   The pt is *** diabetic.  Tobacco hx:  ***  Past Medical History:  Diagnosis Date   Abnormal uterine bleeding    Allergy    Cataract    BILATERAL   Clotting disorder    Fibroid    History of blood transfusion    surgery scar tissue abdominal was on Coumadin and was snicked during surgery   Hyperlipidemia    Hypertension    takes metoprolol and lisinopril   Pre-diabetes    takes metformin qod   STD (sexually transmitted disease)    HSV    Past Surgical History:  Procedure Laterality Date   ABDOMINAL ADHESION SURGERY  2012   required hospital admission d/t complications s/p surgery   DILATATION & CURETTAGE/HYSTEROSCOPY WITH MYOSURE N/A 08/26/2022   Procedure: DILATATION & CURETTAGE/HYSTEROSCOPY WITH MYOSURE;  Surgeon: Jannis Kate Norris, MD;  Location: [facility] [place];  Service: Gynecology;  Laterality: N/A;   IVC FILTER INSERTION  2012   PERIPHERAL VASCULAR THROMBECTOMY Bilateral    IVC filter    Social History   Socioeconomic History   Marital status: Single    Spouse name: Not on file   Number of children: Not on file   Years of education: Not on file   Highest education level: Not on file  Occupational History   Not on file  Tobacco Use   Smoking status: Never    Passive exposure: Past   Smokeless tobacco: Never  Vaping Use   Vaping status: Never Used  Substance and Sexual Activity   Alcohol use: Yes    Comment: OCC   Drug use: No   Sexual activity: Yes    Partners: Male    Birth control/protection: Post-menopausal, None    Comment: MCR -  low risk (less than 5 partners, greater than 16 IC, No hx of STD, No abn pap, No Des exposure, no bc meds  Other Topics Concern   Not on file  Social History Narrative   Not on file   Social Drivers of Health   Financial Resource Strain: Not on file  Food Insecurity: Not on file  Transportation Needs: Not on file  Physical Activity: Not on file  Stress: Not on file  Social Connections: Not on file  Intimate Partner Violence: Not on file   *** Family History  Problem Relation Age of Onset   Heart disease Father    Heart disease Paternal Grandfather    Colon cancer Neg Hx    Breast cancer Neg Hx    Colon polyps Neg Hx    Crohn's disease Neg Hx    Esophageal cancer Neg Hx    Rectal cancer Neg Hx    Stomach cancer Neg Hx    Ulcerative colitis Neg Hx     Current Outpatient Medications  Medication Sig Dispense Refill   cetirizine  (ZYRTEC  ALLERGY) 10 MG tablet Take 1 tablet (10 mg total) by mouth daily. (Patient taking differently: Take 10 mg by mouth as needed.) 30 tablet 0   EPINEPHrine   0.15 MG/0.15ML IJ injection Inject 0.15 mLs (0.15 mg total) into the muscle as needed for anaphylaxis. 2 Device 0   hydrochlorothiazide (HYDRODIURIL) 25 MG tablet Take 25 mg by mouth daily.     HYDROcodone -acetaminophen  (NORCO/VICODIN) 5-325 MG tablet Take 1 tablet by mouth 2 (two) times daily as needed.     lisinopril (ZESTRIL) 10 MG tablet Take 10 mg by mouth daily.     metFORMIN (GLUCOPHAGE) 500 MG tablet Take 500 mg by mouth daily.     methocarbamol  (ROBAXIN ) 500 MG tablet Take 1 tablet (500 mg total) by mouth 2 (two) times daily. 20 tablet 0   metoprolol tartrate (LOPRESSOR) 25 MG tablet Take 25 mg by mouth daily.  3   Multiple Vitamins-Minerals (ONE-A-DAY WOMENS PO) Take by mouth.     PREDNISONE  PO 10 mg.     rosuvastatin (CRESTOR) 10 MG tablet Take by mouth.     triamcinolone  cream (KENALOG ) 0.1 % Apply 1 Application topically 2 (two) times daily. 30 g 0   valACYclovir (VALTREX) 500 MG  tablet Take 500 mg by mouth daily.     No current facility-administered medications for this visit.    Allergies  Allergen Reactions   Cephalexin Nausea And Vomiting   Other     Pt does not accept blood thinners    Sulfa Antibiotics      REVIEW OF SYSTEMS:  *** [X]  denotes positive finding, [ ]  denotes negative finding Cardiac  Comments:  Chest pain or chest pressure:    Shortness of breath upon exertion:    Short of breath when lying flat:    Irregular heart rhythm:        Vascular    Pain in calf, thigh, or hip brought on by ambulation:    Pain in feet at night that wakes you up from your sleep:     Blood clot in your veins:    Leg swelling:         Pulmonary    Oxygen at home:    Productive cough:     Wheezing:         Neurologic    Sudden weakness in arms or legs:     Sudden numbness in arms or legs:     Sudden onset of difficulty speaking or slurred speech:    Temporary loss of vision in one eye:     Problems with dizziness:         Gastrointestinal    Blood in stool:     Vomited blood:         Genitourinary    Burning when urinating:     Blood in urine:        Psychiatric    Major depression:         Hematologic    Bleeding problems:    Problems with blood clotting too easily:        Skin    Rashes or ulcers:        Constitutional    Fever or chills:      PHYSICAL EXAMINATION:  There were no vitals filed for this visit.  General:  WDWN in NAD; vital signs documented above Gait: Not observed HENT: WNL, normocephalic Pulmonary: normal non-labored breathing , without wheezing Cardiac: {Desc; regular/irreg:14544} HR Abdomen: soft, NT, no masses Skin: {With/Without:20273} rashes Vascular Exam/Pulses:  Right Left  Radial {Exam; arterial pulse strength 0-4:30167} {Exam; arterial pulse strength 0-4:30167}  Ulnar {Exam; arterial pulse strength 0-4:30167} {Exam; arterial pulse strength 0-4:30167}  Femoral {Exam; arterial pulse strength  0-4:30167} {Exam; arterial pulse strength 0-4:30167}  Popliteal {Exam; arterial pulse strength 0-4:30167} {Exam; arterial pulse strength 0-4:30167}  DP {Exam; arterial pulse strength 0-4:30167} {Exam; arterial pulse strength 0-4:30167}  PT {Exam; arterial pulse strength 0-4:30167} {Exam; arterial pulse strength 0-4:30167}   Extremities: {With/Without:20273} ischemic changes, {With/Without:20273} Gangrene , {With/Without:20273} cellulitis; {With/Without:20273} open wounds;  Musculoskeletal: no muscle wasting or atrophy  Neurologic: A&O X 3;  No focal weakness or paresthesias are detected Psychiatric:  The pt has {Desc; normal/abnormal:11317::Normal} affect.   Non-Invasive Vascular Imaging:   ***    ASSESSMENT/PLAN: Diana Mack is a 63 y.o. female presenting with ***   ***   Fonda FORBES Rim, MD [facility] [phone]

## 2024-08-26 ENCOUNTER — Ambulatory Visit: Attending: Vascular Surgery | Admitting: Vascular Surgery

## 2024-08-26 ENCOUNTER — Encounter: Payer: Self-pay | Admitting: Vascular Surgery

## 2024-08-26 VITALS — BP 146/85 | HR 59 | Temp 97.9°F | Resp 18 | Ht 62.0 in | Wt 198.1 lb

## 2024-08-26 DIAGNOSIS — Z95828 Presence of other vascular implants and grafts: Secondary | ICD-10-CM | POA: Diagnosis not present
